# Patient Record
Sex: Female | Born: 1960
Health system: Southern US, Community
[De-identification: ages and names within clinical notes are randomized; demographics above are authoritative.]

## PROBLEM LIST (undated history)

## (undated) DIAGNOSIS — C801 Malignant (primary) neoplasm, unspecified: Secondary | ICD-10-CM

## (undated) DIAGNOSIS — M1990 Unspecified osteoarthritis, unspecified site: Secondary | ICD-10-CM

## (undated) DIAGNOSIS — R5383 Other fatigue: Secondary | ICD-10-CM

## (undated) DIAGNOSIS — R112 Nausea with vomiting, unspecified: Secondary | ICD-10-CM

## (undated) DIAGNOSIS — F419 Anxiety disorder, unspecified: Secondary | ICD-10-CM

## (undated) DIAGNOSIS — K5792 Diverticulitis of intestine, part unspecified, without perforation or abscess without bleeding: Secondary | ICD-10-CM

## (undated) DIAGNOSIS — E079 Disorder of thyroid, unspecified: Secondary | ICD-10-CM

## (undated) DIAGNOSIS — R42 Dizziness and giddiness: Secondary | ICD-10-CM

## (undated) DIAGNOSIS — Z9889 Other specified postprocedural states: Secondary | ICD-10-CM

## (undated) DIAGNOSIS — I499 Cardiac arrhythmia, unspecified: Secondary | ICD-10-CM

## (undated) DIAGNOSIS — E039 Hypothyroidism, unspecified: Secondary | ICD-10-CM

## (undated) DIAGNOSIS — J302 Other seasonal allergic rhinitis: Secondary | ICD-10-CM

## (undated) DIAGNOSIS — K5900 Constipation, unspecified: Secondary | ICD-10-CM

## (undated) DIAGNOSIS — D649 Anemia, unspecified: Secondary | ICD-10-CM

## (undated) HISTORY — DX: Constipation, unspecified: K59.00

## (undated) HISTORY — DX: Unspecified osteoarthritis, unspecified site: M19.90

## (undated) HISTORY — DX: Diverticulitis of intestine, part unspecified, without perforation or abscess without bleeding: K57.92

## (undated) HISTORY — DX: Hypothyroidism, unspecified: E03.9

## (undated) HISTORY — DX: Other seasonal allergic rhinitis: J30.2

## (undated) HISTORY — DX: Anxiety disorder, unspecified: F41.9

## (undated) HISTORY — DX: Disorder of thyroid, unspecified: E07.9

## (undated) HISTORY — DX: Other fatigue: R53.83

---
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
## 2001-02-27 ENCOUNTER — Other Ambulatory Visit: Admission: RE | Admit: 2001-02-27 | Discharge: 2001-02-27 | Payer: Self-pay | Admitting: Obstetrics and Gynecology

## 2002-11-04 ENCOUNTER — Other Ambulatory Visit: Admission: RE | Admit: 2002-11-04 | Discharge: 2002-11-04 | Payer: Self-pay | Admitting: Obstetrics and Gynecology

## 2003-12-01 ENCOUNTER — Other Ambulatory Visit: Admission: RE | Admit: 2003-12-01 | Discharge: 2003-12-01 | Payer: Self-pay | Admitting: Obstetrics and Gynecology

## 2010-10-04 ENCOUNTER — Ambulatory Visit
Admission: RE | Admit: 2010-10-04 | Discharge: 2010-10-04 | Disposition: A | Discharge status: Home / Self Care | Payer: 59 | Source: Ambulatory Visit | Attending: Family Medicine | Admitting: Family Medicine

## 2010-10-04 ENCOUNTER — Other Ambulatory Visit: Payer: Self-pay | Admitting: Family Medicine

## 2010-10-04 DIAGNOSIS — T148XXA Other injury of unspecified body region, initial encounter: Secondary | ICD-10-CM

## 2013-01-09 DIAGNOSIS — I499 Cardiac arrhythmia, unspecified: Secondary | ICD-10-CM

## 2013-01-09 HISTORY — DX: Cardiac arrhythmia, unspecified: I49.9

## 2013-05-12 ENCOUNTER — Encounter: Payer: Self-pay | Admitting: Neurology

## 2013-05-12 ENCOUNTER — Ambulatory Visit (INDEPENDENT_AMBULATORY_CARE_PROVIDER_SITE_OTHER): Payer: 59 | Admitting: Neurology

## 2013-05-12 VITALS — BP 104/60 | HR 72 | Resp 16 | Ht 65.5 in | Wt 140.0 lb

## 2013-05-12 DIAGNOSIS — R42 Dizziness and giddiness: Secondary | ICD-10-CM

## 2013-05-12 DIAGNOSIS — R51 Headache: Secondary | ICD-10-CM

## 2013-05-12 DIAGNOSIS — R519 Headache, unspecified: Secondary | ICD-10-CM | POA: Insufficient documentation

## 2013-05-12 NOTE — Patient Instructions (Signed)
1. Bloodwork for ESR, CRP 2. Refer for vestibular therapy 3. If bloodwork normal, we will discuss symptomatic treatment with daily headache preventative medication

## 2013-05-12 NOTE — Progress Notes (Signed)
NEUROLOGY CONSULTATION NOTE  Brooke Green MRN: [id number] DOB: 1960/07/20  Referring provider: Dr. Carol Ada Primary care provider: Dr. Carol Ada  Reason for consult:  New onset persistent headache, dizziness  Dear Dr Tamala Julian:  Thank you for your kind referral of Brooke Green for consultation of the above symptoms. Although her history is well known to you, please allow me to reiterate it for the purpose of our medical record. The patient was accompanied to the clinic by her husband who also provides collateral information. Records and images were personally reviewed where available.  HISTORY OF PRESENT ILLNESS: This is a very pleasant 53 year old right-handed woman with a history of hypothyroidism and anxiety, in her usual state of health until 03/26/2013 when after running 1/2 mile to the house because the bus came early for her daughter, she recalls feeling out of breath, then turned around and could not walk straight. She kept pulling to the right, she felt sick, and had to sit down with her eyes closed because of violent spinning.  This lasted 30 minutes, she had to lay down flat on the floor of her garage until symptoms improved an hour later.  She continued to feel sick the rest of the day.  She was feeling fine the next day, however 2 days later she started having a throbbing pain on the right temple and around her right eye.  This resolved and was followed by a constant diffuse head pressure for 10 days.  She also had dizziness that weekend, and the week after woke up with a "swishing" inside her head, "like water sloshing back and forth."  She had an MRI brain with and without contrast and MRA head which I personally reviewed, no acute abnormalities seen.  There is an incidental finding of a left frontal extra-axial dural based enhancing 1.4 x 1.2 x 0.8 cm lesion consistent with a small meningioma without significant mass effect on the adjacent brain parenchyma. She was started  on a tapering course of prednisone, which helped significantly with the dizziness.  She continued to have headaches, and when the prednisone taper finished, she started having brief minor dizzy spells lasting 15 seconds.  These can occur when turning or bending down, but have also occurred while just standing cutting peppers at the counter.  She reports symptoms are worse when she wakes up in the morning and gets out of bed, but if she wakes up in the middle of the night, she does not feel unsteady.  The headaches resolved after the second course of Prednisone, however 4 days ago she again had throbbing over the right temporal region with tenderness to palpation, followed by diffuse mild pressure.    She feels that over the last couple of weeks, her stomach has not been the same.  There is no abdominal pain, however she feels that "things are not going down quickly," she feels nauseated with an upset stomach.  This has improved somewhat.  She denies any diplopia, dysarthria, dysphagia, neck/back pain, bladder incontinence.  No recent diet changes, no recent travel, no new medications, no recent head injuries or infection except for a stomach virus last weekend.  PAST MEDICAL HISTORY: Past Medical History  Diagnosis Date  . Hypothyroid   . Anxiety     PAST SURGICAL HISTORY: Past Surgical History  Procedure Laterality Date  . Appendectomy  1967  . Cesarean section  1996    MEDICATIONS: No current outpatient prescriptions on file prior to visit.  No current facility-administered medications on file prior to visit.    ALLERGIES: No Known Allergies  FAMILY HISTORY: Family History  Problem Relation Age of Onset  . Thyroid cancer Mother   . Breast cancer Mother   . Skin cancer Mother   . Melanoma Father   . Atrial fibrillation Mother     SOCIAL HISTORY: History   Social History  . Marital Status: Married    Spouse Name: N/A    Number of Children: N/A  . Years of Education: N/A     Occupational History  . Not on file.   Social History Main Topics  . Smoking status: Never Smoker   . Smokeless tobacco: Not on file  . Alcohol Use: Yes     Comment: rarely  . Drug Use: No  . Sexual Activity: Not on file   Other Topics Concern  . Not on file   Social History Narrative  . No narrative on file    REVIEW OF SYSTEMS: Constitutional: No fevers, chills, or sweats, no generalized fatigue, change in appetite Eyes: No visual changes, double vision, eye pain Ear, nose and throat: No hearing loss, ear pain, nasal congestion, sore throat Cardiovascular: No chest pain, palpitations Respiratory:  No shortness of breath at rest or with exertion, wheezes GastrointestinaI: as above Genitourinary:  No dysuria, urinary retention or frequency Musculoskeletal:  No neck pain, back pain Integumentary: No rash, pruritus, skin lesions Neurological: as above Psychiatric: No depression, insomnia, anxiety Endocrine: No palpitations, fatigue, diaphoresis, mood swings, change in appetite, change in weight, increased thirst Hematologic/Lymphatic:  No anemia, purpura, petechiae. Allergic/Immunologic: no itchy/runny eyes, nasal congestion, recent allergic reactions, rashes  PHYSICAL EXAM: Filed Vitals:   05/12/13 1239  BP: 104/60  Pulse: 72  Resp: 16   General: No acute distress Head:  Normocephalic/atraumatic, tenderness to palpation over the right temporal region and slightly in the right occipital region Neck: supple, no paraspinal tenderness, full range of motion Back: No paraspinal tenderness Heart: regular rate and rhythm Lungs: Clear to auscultation bilaterally. Vascular: No carotid bruits. Skin/Extremities: No rash, no edema Neurological Exam: Mental status: alert and oriented to person, place, and time, no dysarthria or aphasia, Fund of knowledge is appropriate.  Recent and remote memory are intact.  Attention and concentration are normal.    Able to name objects and  repeat phrases. Cranial nerves: CN I: not tested CN II: pupils equal, round and reactive to light, visual fields intact, fundi unremarkable. CN III, IV, VI:  full range of motion, no nystagmus, no ptosis CN V: facial sensation intact CN VII: upper and lower face symmetric CN VIII: hearing intact CN IX, X: gag intact, uvula midline CN XI: sternocleidomastoid and trapezius muscles intact CN XII: tongue midline Bulk & Tone: normal, no fasciculations. Motor: 5/5 throughout with no pronator drift. Sensation: intact to light touch, cold, pin, vibration and joint position sense.  No extinction to double simultaneous stimulation.  Romberg test negative Deep Tendon Reflexes: +2 throughout, no ankle clonus Plantar responses: downgoing bilaterally Cerebellar: no incoordination on finger to nose, heel to shin. No dysdiadochokinesia Gait: narrow-based and steady, able to tandem walk adequately. Tremor: none  IMPRESSION: This is a very pleasant 53 year old right-handed woman with a history of hypothyroidism and anxiety presenting for new onset headaches and dizziness that started after she had to quickly run home on 03/26/2013.  The initial episode of vertigo is suggestive of vestibular dysfunction, the etiology of the headaches is unclear.  Her neurological exam  is non-focal, there is tenderness to palpation over the right temporal region.  ESR and CRP will be ordered to assess for temporal arteritis.  Her MRI/MRA head do not show any acute changes, there is a small left frontal meningioma without mass effect that is incidental.  Follow-up imaging will be ordered in a year.  She will be referred for vestibular therapy.  We discussed that if bloodwork is unremarkable and headaches continue, she may benefit from a daily headache preventative medication such as amitriptyline/nortriptyline.  She will follow-up in 2 months.  Thank you for allowing me to participate in the care of this patient. Please do not  hesitate to call for any questions or concerns.   Ellouise Newer, M.D.

## 2013-05-13 ENCOUNTER — Other Ambulatory Visit: Payer: Self-pay | Admitting: Neurology

## 2013-05-13 DIAGNOSIS — F419 Anxiety disorder, unspecified: Secondary | ICD-10-CM | POA: Insufficient documentation

## 2013-05-13 DIAGNOSIS — E039 Hypothyroidism, unspecified: Secondary | ICD-10-CM | POA: Insufficient documentation

## 2013-05-13 LAB — SEDIMENTATION RATE: Sed Rate: 4 mm/hr (ref 0–22)

## 2013-05-14 LAB — C-REACTIVE PROTEIN: CRP: <0.5 mg/dL (ref <0.60)

## 2013-05-14 LAB — RPR

## 2013-06-04 ENCOUNTER — Ambulatory Visit: Payer: 59 | Attending: Neurology | Admitting: Rehabilitative and Restorative Service Providers"

## 2013-06-04 DIAGNOSIS — IMO0001 Reserved for inherently not codable concepts without codable children: Secondary | ICD-10-CM | POA: Diagnosis not present

## 2013-06-04 DIAGNOSIS — R42 Dizziness and giddiness: Secondary | ICD-10-CM | POA: Diagnosis not present

## 2013-06-11 ENCOUNTER — Ambulatory Visit: Payer: 59 | Attending: Neurology | Admitting: Rehabilitative and Restorative Service Providers"

## 2013-06-11 DIAGNOSIS — R42 Dizziness and giddiness: Secondary | ICD-10-CM | POA: Insufficient documentation

## 2013-06-11 DIAGNOSIS — IMO0001 Reserved for inherently not codable concepts without codable children: Secondary | ICD-10-CM | POA: Diagnosis present

## 2013-06-18 ENCOUNTER — Ambulatory Visit: Payer: 59 | Admitting: Rehabilitative and Restorative Service Providers"

## 2013-06-18 DIAGNOSIS — IMO0001 Reserved for inherently not codable concepts without codable children: Secondary | ICD-10-CM | POA: Diagnosis not present

## 2013-07-02 ENCOUNTER — Encounter: Payer: 59 | Admitting: Rehabilitative and Restorative Service Providers"

## 2013-07-15 ENCOUNTER — Ambulatory Visit (INDEPENDENT_AMBULATORY_CARE_PROVIDER_SITE_OTHER): Payer: 59 | Admitting: Neurology

## 2013-07-15 ENCOUNTER — Encounter: Payer: Self-pay | Admitting: *Deleted

## 2013-07-15 ENCOUNTER — Encounter: Payer: Self-pay | Admitting: Neurology

## 2013-07-15 VITALS — BP 120/72 | HR 72 | Resp 16 | Ht 66.0 in | Wt 142.0 lb

## 2013-07-15 DIAGNOSIS — D320 Benign neoplasm of cerebral meninges: Secondary | ICD-10-CM

## 2013-07-15 DIAGNOSIS — R42 Dizziness and giddiness: Secondary | ICD-10-CM

## 2013-07-15 DIAGNOSIS — D329 Benign neoplasm of meninges, unspecified: Secondary | ICD-10-CM

## 2013-07-15 DIAGNOSIS — R51 Headache: Secondary | ICD-10-CM

## 2013-07-15 NOTE — Progress Notes (Signed)
NEUROLOGY FOLLOW UP OFFICE NOTE  Brooke Green [id number]  HISTORY OF PRESENT ILLNESS: I had the pleasure of seeing Brooke Green in follow-up in the neurology clinic on 07/15/2013.  The patient was last seen 2 months ago for dizziness and head pressure.  MRI brain and MRA head unremarkable except for incidental finding of small left frontal meningioma without mass effect.  Her neurological exam was normal. ESR and CRP normal.  She was referred for vestibular therapy, and reports that symptoms settled down after therapy for 3 weeks, however symptoms "returned with avengeance," with dizziness now occurring daily. Bending over would cause brief dizziness, however other times the dizziness would get worse that she would have nausea and feel sick to her stomach, with associated pressure sensation inside her head. She reports the pressure is not a headache, it feels like a movement inside her head. She feels fuzzy with difficulty concentrating, no associated tinnitus, photo/phonophobia, focal numbness/tingling/weakness.  She spoke to her friend with a diagnosis of Postural Orthostatic Tachycardia Syndrome (POTS) and started reading about it, feeling that it described all her symptoms.  She checked her heart rate with positional changes, and noted that HR increased by 35 bpm standing up.  She increased her salt intake the past 2 weeks, and feels that her symptoms are now mild with this change. She is scheduled to see cardiologist Dr. Caryl Comes for evaluation of POTS.  HPI:  This is a very pleasant Brooke Green with a history of hypothyroidism and anxiety, in her usual state of health until 03/26/2013 when after running 1/2 mile to the house because the bus came early for her daughter, she recalls feeling out of breath, then turned around and could not walk straight. She kept pulling to the right, she felt sick, and had to sit down with her eyes closed because of violent spinning. This lasted 30 minutes, she had  to lay down flat on the floor of her garage until symptoms improved an hour later. She continued to feel sick the rest of the day. She was feeling fine the next day, however 2 days later she started having a throbbing pain on the right temple and around her right eye. This resolved and was followed by a constant diffuse head pressure for 10 days. She also had dizziness that weekend, and the week after woke up with a "swishing" inside her head, "like water sloshing back and forth." She had an MRI brain with and without contrast and MRA head which I personally reviewed, no acute abnormalities seen. There is an incidental finding of a left frontal extra-axial dural based enhancing 1.4 x 1.2 x 0.8 cm lesion consistent with a small meningioma without significant mass effect on the adjacent brain parenchyma. She was started on a tapering course of prednisone, which helped significantly with the dizziness. She continued to have headaches, and when the prednisone taper finished, she started having brief minor dizzy spells lasting 15 seconds. These can occur when turning or bending down, but have also occurred while just standing cutting peppers at the counter. She reports symptoms are worse when she wakes up in the morning and gets out of bed, but if she wakes up in the middle of the night, she does not feel unsteady. The headaches resolved after the second course of Prednisone, however 4 days ago she again had throbbing over the right temporal region with tenderness to palpation, followed by diffuse mild pressure.   She feels that over the last  couple of weeks, her stomach has not been the same. There is no abdominal pain, however she feels that "things are not going down quickly," she feels nauseated with an upset stomach. This has improved somewhat.   PAST MEDICAL HISTORY: Past Medical History  Diagnosis Date  . Hypothyroid   . Anxiety     MEDICATIONS: Current Outpatient Prescriptions on File Prior to Visit    Medication Sig Dispense Refill  . levothyroxine (SYNTHROID, LEVOTHROID) 50 MCG tablet Take 50 mcg by mouth daily before breakfast.      . sertraline (ZOLOFT) 25 MG tablet Take 25 mg by mouth daily.       No current facility-administered medications on file prior to visit.    ALLERGIES: No Known Allergies  FAMILY HISTORY: Family History  Problem Relation Age of Onset  . Thyroid cancer Mother   . Breast cancer Mother   . Skin cancer Mother   . Melanoma Father   . Atrial fibrillation Mother     SOCIAL HISTORY: History   Social History  . Marital Status: Married    Spouse Name: N/A    Number of Children: N/A  . Years of Education: N/A   Occupational History  . Not on file.   Social History Main Topics  . Smoking status: Never Smoker   . Smokeless tobacco: Not on file  . Alcohol Use: Yes     Comment: rarely  . Drug Use: No  . Sexual Activity: Not on file   Other Topics Concern  . Not on file   Social History Narrative  . No narrative on file    REVIEW OF SYSTEMS: Constitutional: No fevers, chills, or sweats, no generalized fatigue, change in appetite Eyes: No visual changes, double vision, eye pain Ear, nose and throat: No hearing loss, ear pain, nasal congestion, sore throat Cardiovascular: No chest pain, palpitations Respiratory:  No shortness of breath at rest or with exertion, wheezes GastrointestinaI: + nausea, no vomiting, diarrhea, abdominal pain, fecal incontinence Genitourinary:  No dysuria, urinary retention or frequency Musculoskeletal:  No neck pain, back pain Integumentary: No rash, pruritus, skin lesions Neurological: as above Psychiatric: No depression, insomnia, + anxiety Endocrine: No palpitations, fatigue, diaphoresis, mood swings, change in appetite, change in weight, increased thirst Hematologic/Lymphatic:  No anemia, purpura, petechiae. Allergic/Immunologic: no itchy/runny eyes, nasal congestion, recent allergic reactions,  rashes  PHYSICAL EXAM: Filed Vitals:   07/15/13 1302  BP: 120/72  Pulse: 72  Resp: 16   General: No acute distress Head:  Normocephalic/atraumatic Neck: supple, no paraspinal tenderness, full range of motion Heart:  Regular rate and rhythm Lungs:  Clear to auscultation bilaterally Back: No paraspinal tenderness Skin/Extremities: No rash, no edema Neurological Exam: alert and oriented to person, place, and time. No aphasia or dysarthria. Fund of knowledge is appropriate.  Recent and remote memory are intact.  Attention and concentration are normal.    Able to name objects and repeat phrases. Cranial nerves: Pupils equal, round, reactive to light.  Fundoscopic exam unremarkable, no papilledema. Extraocular movements intact with no nystagmus. Visual fields full. Facial sensation intact. No facial asymmetry. Tongue, uvula, palate midline.  Motor: Bulk and tone normal, muscle strength 5/5 throughout with no pronator drift.  Sensation to light touch, temperature and vibration intact.  No extinction to double simultaneous stimulation.  Deep tendon reflexes 2+ throughout, toes downgoing.  Finger to nose testing intact.  Gait narrow-based and steady, able to tandem walk adequately.  Romberg negative.  IMPRESSION: This is a very pleasant  Brooke year old right-handed Green with a history of hypothyroidism and anxiety who presented with new onset dizziness and head pressure. She reports that this is not a headache, but instead a pressure with movement sensation in her head.  Her neurological exam is normal, brain imaging unremarkable except for incidental finding of small left frontal meningioma that will be followed annually for now.  She feels that her symptoms are consistent with POTS and will be seeing cardiologist Dr. Caryl Comes for this.  She will follow-up on an annual basis to follow-up on meningioma, and knows to call our office for any problems.    Thank you for allowing me to participate in her care.   Please do not hesitate to call for any questions or concerns.  The duration of this appointment visit was 15 minutes of face-to-face time with the patient.  Greater than 50% of this time was spent in counseling, explanation of diagnosis, planning of further management, and coordination of care.   Ellouise Newer, M.D.   CC: Dr. Tamala Julian

## 2013-07-15 NOTE — Addendum Note (Signed)
Addended by: Thurmon Fair on: 07/15/2013 04:51 PM   Modules accepted: Orders

## 2013-07-15 NOTE — Patient Instructions (Addendum)
1. Follow-up with Dr. Caryl Comes as scheduled 2. Follow-up annually for MRI brain for meningioma 3. Call our office for any problems

## 2013-07-16 ENCOUNTER — Ambulatory Visit: Payer: 59 | Admitting: Rehabilitative and Restorative Service Providers"

## 2013-07-16 ENCOUNTER — Encounter: Payer: Self-pay | Admitting: Family Medicine

## 2013-07-24 ENCOUNTER — Encounter: Payer: Self-pay | Admitting: *Deleted

## 2013-07-30 ENCOUNTER — Institutional Professional Consult (permissible substitution): Payer: 59 | Admitting: Internal Medicine

## 2013-08-08 ENCOUNTER — Ambulatory Visit (INDEPENDENT_AMBULATORY_CARE_PROVIDER_SITE_OTHER): Payer: 59 | Admitting: Internal Medicine

## 2013-08-08 ENCOUNTER — Encounter: Payer: Self-pay | Admitting: Internal Medicine

## 2013-08-08 VITALS — BP 124/82 | HR 78 | Ht 66.0 in | Wt 144.0 lb

## 2013-08-08 DIAGNOSIS — R42 Dizziness and giddiness: Secondary | ICD-10-CM

## 2013-08-08 NOTE — Patient Instructions (Signed)
Your physician recommends that you continue on your current medications as directed. Please refer to the Current Medication list given to you today.  Your physician has recommended that you have a tilt table test. This test is sometimes used to help determine the cause of fainting spells. You lie on a table that moves from a lying down to an upright position. The change in position can bring on loss of consciousness. The doctor monitors your symptoms, heart rate, EKG, and blood pressure throughout the test. The doctor also may give you a medicine and then monitor your response to the medicine. This is done in the hospital and usually takes half of a day to complete the procedure. Please see the instruction sheet given to you today for more information.  The following are available dates:  8/19, 8/21, 8/24, 8/31 -- Sherri, RN  will contact you next week to schedule this.   Tilt Table Test A tilt table test is a test to evaluate the cause of unexplained fainting (syncope) or dizziness. You will be safely secured to a table that moves you from a lying position to an upright position. During the test, your heart rate, heart rhythm, and blood pressure will be monitored. Your caregiver may order a tilt table test for the following reasons:  Recurrent, unexplained loss of consciousness.  Recurrent near loss of consciousness.  Recurrent, unexplained dizziness.  Recurrent falls, especially in elderly people.  Sudden drop in blood pressure when standing up (orthostatic hypotension). Causes of unexplained fainting or near fainting can involve an imbalance between the nervous and vascular systems. Fainting can occur from a drop in blood pressure or from a drop in heart rate that causes a drop in blood pressure.  RISKS AND COMPLICATIONS During the test, you may:  Feel lightheaded or dizzy.  Faint.  Feel nauseous or vomit.  Have blurry vision.  Feel cold or clammy. BEFORE THE PROCEDURE  Do not eat  before the test. Your caregiver will tell you how many hours before the test you need to stop eating.  Ask your caregiver about changing or stopping your regular medicines. This is especially important if you are taking diabetes or blood pressure medicine.  Make sure you let your primary caregiver know that you are having a tilt table test. PROCEDURE  An intravenous line (IV) will be inserted into a vein in your hand or arm.  Your heart rate, heart rhythm, blood pressure, and oxygen saturation will be continuously monitored throughout the test.  You will lie down on a table. The table will have a footboard and safety straps to keep you in place.  While lying flat, the neck arteries (carotid arteries) on either side of your neck will be checked. This involves your caregiver listening with a stethoscope to the carotid arteries to check for abnormal sounds (bruits). Your caregiver will then press on the neck arteries one at a time for several seconds. These steps will be repeated when you are in an upright position.  After you have been lying down for a period of time, you will be placed in an upright position. If you have any feeling of dizziness or feel like you are going to faint, let your caregiver know right away.  If you have dizziness, a drop in blood pressure, or a drop in heart rate, the table will be immediately placed back in a flat or head down position. If your heart rate or blood pressure does not return to normal after being  placed in a flat position, medicine can be given to help with symptoms of low blood pressure or heart rate.  If you do not have symptoms when placed in an upright position, medicine may be given to provoke symptoms of dizziness while you are in an upright position for the test. Medicine may be given under the tongue or in the IV. AFTER THE PROCEDURE  When your vital signs are stable and you do not have symptoms of dizziness or feel like you may faint, you may be  allowed to go home.  Arrange for someone to drive you home.  Ask when your test results will be ready. Make sure you get your test results. Document Released: 12/08/2003 Document Revised: 06/27/2011 Document Reviewed: 03/30/2011 [facility] Patient Information 2015 [place], [place]. This information is not intended to replace advice given to you by your health care provider. Make sure you discuss any questions you have with your health care provider.

## 2013-08-08 NOTE — Progress Notes (Signed)
Primary Care Physician: Reginia Naas, MD Referring Physician: Same    Brooke Green is a 53 y.o. female with a h/o  thyroid disease, anxiety here today to be evaluated for POTS. Her symptoms started in March when she had to run to her daughter's school bus. She returned to the house feeling off balance, nauseated, with a sensation of the room spinning. Her symptoms abated after 1-1/2 hours. The next day she felt fine. After that she developed a headache with dizziness and felt like water was moving around in her head. She presented to her PCP who ordered  an MRA which was unremarkable except for incidental findings of a small left frontal meningioma without mass effect. She was treated with prednisone x2 which  helped improve her symptoms, but they returned after taper was completed She was evaluated by neurology due to persistent symptoms. Her symptoms did not seem to be consistent with a neurologic etiology and she was referred to ENT for evaluation of benign positional vertigo. She did undergo  epley maneuvers with somewhat improvement of symptoms.  However,symptoms have persisted.  She has 2 different sets of symptoms. One is  brief episodes of dizziness lasting less than 5-10 seconds. She is able to carry out her daily activities without any significant limitations.  Her other set of symptoms are more life style limiting and include prolonged spells of dizziness associated with pressure headache and nausea. Prolonged spells usually will last for a whole day and will start after she assumes an upright position in the a.m. Will improve assuming a lying position.  She has a good friend who has the diagnosis of POTS and she encouraged her to read up on the condition. Patient noticed when she increases her salt intake her symptoms are less. Heat related activities such a showering and exercising do not seem to exacerbate symptoms. Orthostatic vital signs, done in the office today do not show any  significant orthostasis and do not support the diagnosis of POTS.  Today, she denies symptoms of palpitations, chest pain, shortness of breath, orthopnea, PND, lower extremity edema. Today, she is having one of her prolonged spells of dizziness headache and nausea.  Past Medical History  Diagnosis Date  . Hypothyroid   . Anxiety   . Nephrolithiasis   . Constipation   . Diverticulitis   . Seasonal allergies   . Constipation   . Fatigue   . Arthritis   . Thyroid disease    Past Surgical History  Procedure Laterality Date  . Appendectomy  1967  . Cesarean section  1996    Current Outpatient Prescriptions  Medication Sig Dispense Refill  . doxylamine, Sleep, (SLEEP AID) 25 MG tablet Take 25 mg by mouth at bedtime as needed.      Marland Kitchen levothyroxine (SYNTHROID, LEVOTHROID) 50 MCG tablet Take 50 mcg by mouth daily before breakfast.      . sertraline (ZOLOFT) 25 MG tablet Take 25 mg by mouth daily.       No current facility-administered medications for this visit.    No Known Allergies  History   Social History  . Marital Status: Married    Spouse Name: N/A    Number of Children: N/A  . Years of Education: N/A   Occupational History  . Not on file.   Social History Main Topics  . Smoking status: Never Smoker   . Smokeless tobacco: Not on file  . Alcohol Use: Yes     Comment: rarely  . Drug  Use: No  . Sexual Activity: Not on file   Other Topics Concern  . Not on file   Social History Narrative  . No narrative on file    Family History  Problem Relation Age of Onset  . Thyroid cancer Mother   . Breast cancer Mother   . Skin cancer Mother   . Atrial fibrillation Mother   . Hypertension Mother   . Melanoma Father     ROS- All systems are reviewed and negative except as per the HPI above  Physical Exam: Filed Vitals:   08/08/13 1043  BP: 124/82  Pulse: 78  Height: 5\' 6"  (1.676 m)  Weight: 65.318 kg (144 lb)    GEN- The patient is well appearing, alert  and oriented x 3 today.   Head- normocephalic, atraumatic Eyes-  Sclera clear, conjunctiva pink Ears- hearing intact Oropharynx- clear Neck- supple, no JVP Lymph- no cervical lymphadenopathy Lungs- Clear to ausculation bilaterally, normal work of breathing Heart- Regular rate and rhythm, no murmurs, rubs or gallops, PMI not laterally displaced GI- soft, NT, ND, + BS Extremities- no clubbing, cyanosis, or edema MS- no significant deformity or atrophy Skin- no rash or lesion Psych- euthymic mood, full affect Neuro- strength and sensation are intact  EKG- reveals normal sinus rhythm at 78 beats per minute normal EKG with a QTC of 490 ms.  Assessment and Plan:  1. Symptoms, questionable etiology.  Does not sound typical for  POTS, but  may represent  an atypical POTS.  To be scheduled for a tilt table  at [facility] for further evaluation.

## 2013-08-08 NOTE — Progress Notes (Signed)
Lightheadedness  Vertigo  The patient has an unusual symptom complex with which I am unfamiliar. They started abruptly with what sounds like acute vertigo. Since then Brooke Green has had problems with recurring headaches that are associated with dizziness which appears to be orthostatic. Brooke Green has reported changes in her heart rate at home in the 30 beat per minute range upon standing. These are not reproducing her. Brooke Green has tried, in the interval, to increase her salt and water intake and thought that her symptoms were improving on this; recently they have worsened again.  The question for my perspective is does this represent an atypical this autonomic syndrome as suggested by the relief of lightheadedness with sitting or at altogether different type of syndrome as suggested by the associated headaches in the initiating of this event without acute vertigo.  Brooke Green has appropriate autonomic innervation of her heart the respiratory ratios. There is no other manifestations of systemic autonomic insufficiency.  I suggested we undertake tilt table testing to see whether we can objectively clarify the presence of an autonomic disorder Brooke Green is agreeable

## 2013-08-14 ENCOUNTER — Telehealth: Payer: Self-pay | Admitting: *Deleted

## 2013-08-14 NOTE — Telephone Encounter (Signed)
Left message to call back. Need to arrange tilt table testing. Left message that I would be out of the office until 8/18.

## 2013-08-25 ENCOUNTER — Telehealth: Payer: Self-pay | Admitting: Internal Medicine

## 2013-08-25 NOTE — Telephone Encounter (Signed)
See phone note 08/14/13, will forward to [facility]. LM for pt to expect call 08/26/13.

## 2013-08-25 NOTE — Telephone Encounter (Signed)
Follow Up  Pt returned call to schedule tilt table test// SR

## 2013-08-28 ENCOUNTER — Institutional Professional Consult (permissible substitution): Payer: 59 | Admitting: Internal Medicine

## 2013-09-12 NOTE — Telephone Encounter (Signed)
Patient calls back telling me how much better dizziness is (she states best it has been since March). She states that it is in "remission" for now. She would like to try and cut back on her salt intake and see what happens - see if symptoms return.  She would like to hold off on tilt table testing for now and will come in for OV to discuss next step in plan of care. She will call back if symptoms return between now and then. She is aware scheduler will call her next week to arrange follow up with Dr. Caryl Comes in October.

## 2013-09-25 ENCOUNTER — Encounter: Payer: Self-pay | Admitting: Internal Medicine

## 2013-11-07 ENCOUNTER — Ambulatory Visit (INDEPENDENT_AMBULATORY_CARE_PROVIDER_SITE_OTHER): Payer: 59 | Admitting: Internal Medicine

## 2013-11-07 ENCOUNTER — Encounter: Payer: Self-pay | Admitting: Internal Medicine

## 2013-11-07 VITALS — BP 137/73 | HR 88 | Ht 66.0 in | Wt 148.0 lb

## 2013-11-07 DIAGNOSIS — R42 Dizziness and giddiness: Secondary | ICD-10-CM

## 2013-11-07 DIAGNOSIS — R000 Tachycardia, unspecified: Secondary | ICD-10-CM

## 2013-11-07 NOTE — Progress Notes (Signed)
      Patient Care Team: Candace Wyline Copas, MD as PCP - General (Family Medicine)   HPI  Brooke Green is a 53 y.o. female Seen in [place] for an unusual symptom complex characterized byvertigo nausea and orthostatic stress,.  She has spells of dizziness which can be brief or assoc with headache and last all day ong  These have begun abruptly in march when we saw her in July recommended tilt which was deferred as she became much less symptomatic  Overall she has been better. She does have periodic episodes however a feeling anxious associated with her heart racing. Her blood work is drawn routinely by Dr. Matthew Saras and was last done in May at which point we presume it was normal although these data are not available.    Past Medical History  Diagnosis Date  . Hypothyroid   . Anxiety   . Nephrolithiasis   . Constipation   . Diverticulitis   . Seasonal allergies   . Constipation   . Fatigue   . Arthritis   . Thyroid disease     Past Surgical History  Procedure Laterality Date  . Appendectomy  1967  . Cesarean section  1996    Current Outpatient Prescriptions  Medication Sig Dispense Refill  . DENTA 5000 PLUS 1.1 % CREA dental cream Take 1 application by mouth daily.      Marland Kitchen doxylamine, Sleep, (SLEEP AID) 25 MG tablet Take 25 mg by mouth at bedtime as needed.      Marland Kitchen levothyroxine (SYNTHROID, LEVOTHROID) 50 MCG tablet Take 50 mcg by mouth daily before breakfast.       No current facility-administered medications for this visit.    No Known Allergies  Review of Systems negative except from HPI and PMH  Physical Exam BP 137/73  Pulse 88  Ht 5\' 6"  (1.676 m)  Wt 148 lb (67.132 kg)  BMI 23.90 kg/m2 Well developed and nourished in no acute distress HENT normal Neck supple with JVP-flat Clear Regular rate and rhythm, no murmurs or gallops Abd-soft with active BS No Clubbing cyanosis edema Skin-warm and dry A & Oriented  Grossly normal sensory and motor  function]  ECG demonstrates sinus rhythm at 74 Interval 11/16/34   Assessment and  Plan  Tachycardia  Lightheadedness  Treated hypothyroidism  Her tachycardia seems to have abated before we got a ECG; I wonder whether she doesn't have an atrial tachycardia. We will use [employer] monitor to try to elucidate.  Otherwise she will continue on salt and water repletion. We'll see her in 6 months

## 2013-11-07 NOTE — Patient Instructions (Signed)
Your physician recommends that you continue on your current medications as directed. Please refer to the Current Medication list given to you today.  Your physician wants you to follow-up in: 1 year with Dr. Klein.  You will receive a reminder letter in the mail two months in advance. If you don't receive a letter, please call our office to schedule the follow-up appointment.  

## 2014-05-25 ENCOUNTER — Encounter: Payer: Self-pay | Admitting: Neurology

## 2014-05-25 ENCOUNTER — Ambulatory Visit (INDEPENDENT_AMBULATORY_CARE_PROVIDER_SITE_OTHER): Payer: 59 | Admitting: Neurology

## 2014-05-25 VITALS — BP 100/70 | HR 90 | Resp 16 | Ht 65.5 in | Wt 147.0 lb

## 2014-05-25 DIAGNOSIS — D329 Benign neoplasm of meninges, unspecified: Secondary | ICD-10-CM

## 2014-05-25 NOTE — Progress Notes (Signed)
NEUROLOGY FOLLOW UP OFFICE NOTE  Brooke Green [id number]  HISTORY OF PRESENT ILLNESS: I had the pleasure of seeing Brooke Green in follow-up in the neurology clinic on 05/25/2014.  The patient was last seen 10 months ago for dizziness and head pressure. She had also been concerned about episodes of tachycardia and has seen cardiologist Dr. Caryl Comes. A tilt table test was recommended, however she cancelled this due to feeling much better. She denies any further dizziness or headaches since September 2015. She had 2 weeks in February where she noticed her heart rate was elevated (between 95 to 110 bpm) even when sitting down, with associated fatigue. This has not recurred. She denies any focal numbness/tingling/weakness, vision changes, dysarthria/dysphagia, neck/back pain. Her MRI brain had shown an incidental finding of a small left frontal meningioma without mass effect.  HPI: This is a very pleasant 54 yo RH woman with a history of hypothyroidism and anxiety, in her usual state of health until 03/26/2013 when after running 1/2 mile to the house because the bus came early for her daughter, she recalls feeling out of breath, then turned around and could not walk straight. She kept pulling to the right, she felt sick, and had to sit down with her eyes closed because of violent spinning. This lasted 30 minutes, she had to lay down flat on the floor of her garage until symptoms improved an hour later. She continued to feel sick the rest of the day. She was feeling fine the next day, however 2 days later she started having a throbbing pain on the right temple and around her right eye. This resolved and was followed by a constant diffuse head pressure for 10 days. She also had dizziness that weekend, and the week after woke up with a "swishing" inside her head, "like water sloshing back and forth." She had an MRI brain with and without contrast and MRA head which I personally reviewed, no acute abnormalities  seen. There is an incidental finding of a left frontal extra-axial dural based enhancing 1.4 x 1.2 x 0.8 cm lesion consistent with a small meningioma without significant mass effect on the adjacent brain parenchyma. She was started on a tapering course of prednisone, which helped significantly with the dizziness. She continued to have headaches, and when the prednisone taper finished, she started having brief minor dizzy spells lasting 15 seconds. These can occur when turning or bending down, but have also occurred while just standing cutting peppers at the counter. She reports symptoms are worse when she wakes up in the morning and gets out of bed, but if she wakes up in the middle of the night, she does not feel unsteady. The headaches resolved after the second course of Prednisone, however 4 days ago she again had throbbing over the right temporal region with tenderness to palpation, followed by diffuse mild pressure.   Diagnostic Data: MRI brain and MRA head unremarkable except for incidental finding of small left frontal meningioma without mass effect. Her neurological exam was normal. ESR and CRP normal.   PAST MEDICAL HISTORY: Past Medical History  Diagnosis Date  . Hypothyroid   . Anxiety   . Nephrolithiasis   . Constipation   . Diverticulitis   . Seasonal allergies   . Constipation   . Fatigue   . Arthritis   . Thyroid disease     MEDICATIONS: Current Outpatient Prescriptions on File Prior to Visit  Medication Sig Dispense Refill  . DENTA 5000 PLUS 1.1 %  CREA dental cream Take 1 application by mouth daily.    Marland Kitchen doxylamine, Sleep, (SLEEP AID) 25 MG tablet Take 25 mg by mouth at bedtime as needed.    Marland Kitchen levothyroxine (SYNTHROID, LEVOTHROID) 50 MCG tablet Take 50 mcg by mouth daily before breakfast.     No current facility-administered medications on file prior to visit.    ALLERGIES: No Known Allergies  FAMILY HISTORY: Family History  Problem Relation Age of Onset  .  Thyroid cancer Mother   . Breast cancer Mother   . Skin cancer Mother   . Atrial fibrillation Mother   . Hypertension Mother   . Melanoma Father     SOCIAL HISTORY: History   Social History  . Marital Status: Married    Spouse Name: N/A  . Number of Children: N/A  . Years of Education: N/A   Occupational History  . Not on file.   Social History Main Topics  . Smoking status: Never Smoker   . Smokeless tobacco: Not on file  . Alcohol Use: Yes     Comment: rarely  . Drug Use: No  . Sexual Activity: Not on file   Other Topics Concern  . Not on file   Social History Narrative    REVIEW OF SYSTEMS: Constitutional: No fevers, chills, or sweats, no generalized fatigue, change in appetite Eyes: No visual changes, double vision, eye pain Ear, nose and throat: No hearing loss, ear pain, nasal congestion, sore throat Cardiovascular: No chest pain, palpitations Respiratory:  No shortness of breath at rest or with exertion, wheezes GastrointestinaI: No nausea, vomiting, diarrhea, abdominal pain, fecal incontinence Genitourinary:  No dysuria, urinary retention or frequency Musculoskeletal:  No neck pain, back pain Integumentary: No rash, pruritus, skin lesions Neurological: as above Psychiatric: No depression, insomnia, anxiety Endocrine: No palpitations, fatigue, diaphoresis, mood swings, change in appetite, change in weight, increased thirst Hematologic/Lymphatic:  No anemia, purpura, petechiae. Allergic/Immunologic: no itchy/runny eyes, nasal congestion, recent allergic reactions, rashes  PHYSICAL EXAM: Filed Vitals:   05/25/14 1058  BP: 100/70  Pulse: 90  Resp: 16   General: No acute distress Head:  Normocephalic/atraumatic Neck: supple, no paraspinal tenderness, full range of motion Heart:  Regular rate and rhythm Lungs:  Clear to auscultation bilaterally Back: No paraspinal tenderness Skin/Extremities: No rash, no edema Neurological Exam: alert and oriented to  person, place, and time. No aphasia or dysarthria. Fund of knowledge is appropriate.  Recent and remote memory are intact. 3/3 delayed recall.  Attention and concentration are normal.    Able to name objects and repeat phrases. Cranial nerves: Pupils equal, round, reactive to light.  Fundoscopic exam unremarkable, no papilledema. Extraocular movements intact with no nystagmus. Visual fields full. Facial sensation intact. No facial asymmetry. Tongue, uvula, palate midline.  Motor: Bulk and tone normal, muscle strength 5/5 throughout with no pronator drift.  Sensation to light touch intact.  No extinction to double simultaneous stimulation.  Deep tendon reflexes 2+ throughout, toes downgoing.  Finger to nose testing intact.  Gait narrow-based and steady, able to tandem walk adequately.  Romberg negative.  IMPRESSION: This is a very pleasant 53 yo RH woman with a history of hypothyroidism and anxiety who presented with new onset dizziness and head pressure. This has resolved without recurrence in September 2015. She had episodes of tachycardia, last episode was in February. She has not been able to use the monitor Dr. Caryl Comes recommended. Her neurological exam is normal, brain imaging unremarkable except for incidental finding of small left  frontal meningioma that will be followed annually for now. She will follow-up on an annual basis to follow-up on meningioma, and knows to call our office for any problems.   Thank you for allowing me to participate in her care.  Please do not hesitate to call for any questions or concerns.  The duration of this appointment visit was 15 minutes of face-to-face time with the patient.  Greater than 50% of this time was spent in counseling, explanation of diagnosis, planning of further management, and coordination of care.   Ellouise Newer, M.D.   CC: Dr. Tamala Julian, Dr. Caryl Comes

## 2014-05-25 NOTE — Patient Instructions (Addendum)
1. Schedule MRI brain with and without contrast for meningioma follow-up  2. Follow-up in 1 year, call our office for any changes 3. YOU HAVE BEN SCHEDULED AT [facility] FOR A BRAIN MRI ON 05/29/14. PLEASE ARRIVE @ 10:00 AM.   [address] [place], [place] [address]  [phone]

## 2014-06-11 ENCOUNTER — Telehealth: Payer: Self-pay | Admitting: Family Medicine

## 2014-06-11 NOTE — Telephone Encounter (Signed)
Patient was notified of results.  

## 2014-06-11 NOTE — Telephone Encounter (Signed)
-----   Message from Cameron Sprang, MD sent at 06/11/2014  8:27 AM EDT ----- Regarding: results Pls let her know I reviewed follow-up brain MRI, meningioma size is unchanged. Follow-up in 1 year, call for any changes. Thanks

## 2015-02-24 ENCOUNTER — Encounter: Payer: Self-pay | Admitting: Neurology

## 2015-05-25 ENCOUNTER — Ambulatory Visit: Payer: 59 | Admitting: Neurology

## 2015-06-10 ENCOUNTER — Encounter: Payer: Self-pay | Admitting: Family Medicine

## 2015-08-30 ENCOUNTER — Telehealth: Payer: Self-pay | Admitting: Neurology

## 2015-08-30 ENCOUNTER — Other Ambulatory Visit: Payer: Self-pay | Admitting: *Deleted

## 2015-08-30 DIAGNOSIS — D329 Benign neoplasm of meninges, unspecified: Secondary | ICD-10-CM

## 2015-08-30 NOTE — Telephone Encounter (Signed)
MRI has been ordered for [name] (same location as she went to for last MRI). Per Dr Delice Lesch, pt still needs to be seen for follow up visit even if MRI is not completed prior to the OV appt.  Labs have been ordered too, these labs will need to be drawn prior to MRI.

## 2015-08-30 NOTE — Telephone Encounter (Signed)
Pls order MRI brain with and without contrast for Dx: meningioma. Ok to keep f/u even if MRI not done. Thanks

## 2015-08-30 NOTE — Telephone Encounter (Signed)
Brooke Green  2060/12/16. Her # is [id number]. She is coming up on her 1 year appointment follow up with Dr. Delice Lesch. The patient thought she was to have an MRI before this appointment. She is unsure. She will see Dr. Delice Lesch on Wednesday 09/08/15. Thank you

## 2015-08-30 NOTE — Telephone Encounter (Signed)
I do not see any documentation in last OV stating this.  Please advise

## 2015-08-31 NOTE — Telephone Encounter (Signed)
Notified pt of MRI ordered and Labs that need to be drawn.

## 2015-09-07 ENCOUNTER — Other Ambulatory Visit (INDEPENDENT_AMBULATORY_CARE_PROVIDER_SITE_OTHER): Payer: 59

## 2015-09-07 DIAGNOSIS — D329 Benign neoplasm of meninges, unspecified: Secondary | ICD-10-CM

## 2015-09-07 LAB — CREATININE, SERUM: Creatinine, Ser: 0.8 mg/dL (ref 0.40–1.20)

## 2015-09-07 LAB — BUN: BUN: 11 mg/dL (ref 6–23)

## 2015-09-08 ENCOUNTER — Encounter: Payer: Self-pay | Admitting: Neurology

## 2015-09-08 ENCOUNTER — Ambulatory Visit (INDEPENDENT_AMBULATORY_CARE_PROVIDER_SITE_OTHER): Payer: 59 | Admitting: Neurology

## 2015-09-08 ENCOUNTER — Ambulatory Visit: Payer: 59 | Admitting: Neurology

## 2015-09-08 VITALS — BP 106/74 | HR 77 | Temp 98.3°F | Ht 65.5 in | Wt 143.5 lb

## 2015-09-08 DIAGNOSIS — R42 Dizziness and giddiness: Secondary | ICD-10-CM | POA: Diagnosis not present

## 2015-09-08 DIAGNOSIS — D329 Benign neoplasm of meninges, unspecified: Secondary | ICD-10-CM | POA: Diagnosis not present

## 2015-09-08 MED ORDER — MECLIZINE HCL 12.5 MG PO TABS: 12.5000 mg | ORAL_TABLET | Freq: Three times a day (TID) | ORAL | 5 refills | Status: DC | PRN

## 2015-09-08 NOTE — Progress Notes (Addendum)
NEUROLOGY FOLLOW UP OFFICE NOTE  TRECIA MARING [id number]  HISTORY OF PRESENT ILLNESS: I had the pleasure of seeing Brooke Green in follow-up in the neurology clinic on 09/08/2015.  The patient was last seen a year ago for dizziness and head pressure. As part of her workup, an incidental finding of a small left frontal meningioma without mass effect was seen. Interval annual scans have been stable. She had an MRI brain with and without contrast done today, meningioma appears stable by my read, no mass effect seen, however formal report is not yet available. She reports being symptom-free for the past 1-1/2 years until 07/11/15 while standing in line at a reunion, her surroundings suddenly started moving. Symptoms settled down when she sat down, so she ate but then vertigo got worse and within 15 minutes she could not move her head without significant vertigo. Symptoms were "violent" for a couple of hours, she had to keep her eyes closed. She had a "halo headache" for a week or so and still felt dizzy for a couple of days. She did have a respiratory viral infection the week prior, and reports that since the initial dizzy spell, she has been more sensitive to viruses. She denies any tinnitus or hearing loss. No head injuries. She denies any falls. She has not had any major headaches, no diplopia or focal numbness/tingling/weakness. She wonders about her left maxillary sinus, which has been bothering her recently.   HPI: This is a very pleasant 55 yo RH woman with a history of hypothyroidism and anxiety, in her usual state of health until 03/26/2013 when after running 1/2 mile to the house because the bus came early for her daughter, she recalls feeling out of breath, then turned around and could not walk straight. She kept pulling to the right, she felt sick, and had to sit down with her eyes closed because of violent spinning. This lasted 30 minutes, she had to lay down flat on the floor of her garage until  symptoms improved an hour later. She continued to feel sick the rest of the day. She was feeling fine the next day, however 2 days later she started having a throbbing pain on the right temple and around her right eye. This resolved and was followed by a constant diffuse head pressure for 10 days. She also had dizziness that weekend, and the week after woke up with a "swishing" inside her head, "like water sloshing back and forth." She had an MRI brain with and without contrast and MRA head which I personally reviewed, no acute abnormalities seen. There is an incidental finding of a left frontal extra-axial dural based enhancing 1.4 x 1.2 x 0.8 cm lesion consistent with a small meningioma without significant mass effect on the adjacent brain parenchyma. She was started on a tapering course of prednisone, which helped significantly with the dizziness. She continued to have headaches, and when the prednisone taper finished, she started having brief minor dizzy spells lasting 15 seconds. These can occur when turning or bending down, but have also occurred while just standing cutting peppers at the counter. She reports symptoms are worse when she wakes up in the morning and gets out of bed, but if she wakes up in the middle of the night, she does not feel unsteady. The headaches resolved after the second course of Prednisone, however 4 days ago she again had throbbing over the right temporal region with tenderness to palpation, followed by diffuse mild pressure.  Diagnostic Data: MRI brain and MRA head unremarkable except for incidental finding of small left frontal meningioma without mass effect (stable on repeat imaging in 2017). Her neurological exam was normal. ESR and CRP normal.   PAST MEDICAL HISTORY: Past Medical History:  Diagnosis Date  . Anxiety   . Arthritis   . Constipation   . Constipation   . Diverticulitis   . Fatigue   . Hypothyroid   . Nephrolithiasis   . Seasonal allergies   .  Thyroid disease     MEDICATIONS: Current Outpatient Prescriptions on File Prior to Visit  Medication Sig Dispense Refill  . DENTA 5000 PLUS 1.1 % CREA dental cream Take 1 application by mouth daily.    Marland Kitchen doxylamine, Sleep, (SLEEP AID) 25 MG tablet Take 25 mg by mouth at bedtime as needed.    Marland Kitchen levothyroxine (SYNTHROID, LEVOTHROID) 50 MCG tablet Take 50 mcg by mouth daily before breakfast.     No current facility-administered medications on file prior to visit.     ALLERGIES: No Known Allergies  FAMILY HISTORY: Family History  Problem Relation Age of Onset  . Thyroid cancer Mother   . Breast cancer Mother   . Skin cancer Mother   . Atrial fibrillation Mother   . Hypertension Mother   . Melanoma Father     SOCIAL HISTORY: Social History   Social History  . Marital status: Married    Spouse name: N/A  . Number of children: N/A  . Years of education: N/A   Occupational History  . Not on file.   Social History Main Topics  . Smoking status: Never Smoker  . Smokeless tobacco: Not on file  . Alcohol use Yes     Comment: rarely  . Drug use: No  . Sexual activity: Not on file   Other Topics Concern  . Not on file   Social History Narrative  . No narrative on file    REVIEW OF SYSTEMS: Constitutional: No fevers, chills, or sweats, no generalized fatigue, change in appetite Eyes: No visual changes, double vision, eye pain Ear, nose and throat: No hearing loss, ear pain, nasal congestion, sore throat Cardiovascular: No chest pain, palpitations Respiratory:  No shortness of breath at rest or with exertion, wheezes GastrointestinaI: No nausea, vomiting, diarrhea, abdominal pain, fecal incontinence Genitourinary:  No dysuria, urinary retention or frequency Musculoskeletal:  No neck pain, back pain Integumentary: No rash, pruritus, skin lesions Neurological: as above Psychiatric: No depression, insomnia, anxiety Endocrine: No palpitations, fatigue, diaphoresis, mood  swings, change in appetite, change in weight, increased thirst Hematologic/Lymphatic:  No anemia, purpura, petechiae. Allergic/Immunologic: no itchy/runny eyes, nasal congestion, recent allergic reactions, rashes  PHYSICAL EXAM: Vitals:   09/08/15 1528  BP: 106/74  Pulse: 77  Temp: 98.3 F (36.8 C)   General: No acute distress Head:  Normocephalic/atraumatic Neck: supple, no paraspinal tenderness, full range of motion Heart:  Regular rate and rhythm Lungs:  Clear to auscultation bilaterally Back: No paraspinal tenderness Skin/Extremities: No rash, no edema Neurological Exam: alert and oriented to person, place, and time. No aphasia or dysarthria. Fund of knowledge is appropriate.  Recent and remote memory are intact. 3/3 delayed recall.  Attention and concentration are normal.    Able to name objects and repeat phrases. Cranial nerves: Pupils equal, round, reactive to light.  Fundoscopic exam unremarkable, no papilledema. Extraocular movements intact with no nystagmus. Visual fields full. Facial sensation intact. No facial asymmetry. Tongue, uvula, palate midline.  Motor: Bulk and tone  normal, muscle strength 5/5 throughout with no pronator drift.  Sensation to light touch intact.  No extinction to double simultaneous stimulation.  Deep tendon reflexes 2+ throughout, toes downgoing.  Finger to nose testing intact.  Gait narrow-based and steady, able to tandem walk adequately.  Romberg negative.  IMPRESSION: This is a very pleasant 55 yo RH woman with a history of hypothyroidism and anxiety who presented with new onset dizziness and head pressure. This has resolved without recurrence since September 2015. In the past year, she has had one episode of vertigo that lasted for several hours and took several days to recover. This occurred a week after a viral infection, possibly vestibular neuritis. She denies hearing loss or vertigo, but will discuss symptoms with her ENT Dr. Janace Hoard, as well as her  concerns for left maxillary sinusitis. She was given a prescription for prn meclizine 12.22m for vertigo, side effects were discussed. Her neurological exam is normal, brain imaging unremarkable except for incidental finding of small left frontal meningioma that will be followed annually for now. She will follow-up on an annual basis to follow-up on meningioma, and knows to call our office for any problems.   Thank you for allowing me to participate in her care.  Please do not hesitate to call for any questions or concerns.  The duration of this appointment visit was 15 minutes of face-to-face time with the patient.  Greater than 50% of this time was spent in counseling, explanation of diagnosis, planning of further management, and coordination of care.   KEllouise Newer M.D.   CC: Dr. STamala Julian Dr. JMelissa Montane  ADDENDUM 09/14/15: Received MRI report, meningioma is stable 1.3 x 0.7 x 0.9 cm, no mass effect, no additional lesions.

## 2015-09-08 NOTE — Patient Instructions (Signed)
You look great. Follow-up in 1 year for meningioma follow-up. Please call our office a month prior to schedule MRI brain with and without contrast. Let us know when your appointment with Dr. Janace Hoard will be and we will send your records.   Have a great year!

## 2016-02-08 DIAGNOSIS — Z13820 Encounter for screening for osteoporosis: Secondary | ICD-10-CM | POA: Diagnosis not present

## 2016-02-15 DIAGNOSIS — M8588 Other specified disorders of bone density and structure, other site: Secondary | ICD-10-CM | POA: Diagnosis not present

## 2016-02-15 DIAGNOSIS — M19041 Primary osteoarthritis, right hand: Secondary | ICD-10-CM | POA: Diagnosis not present

## 2016-02-15 DIAGNOSIS — M19042 Primary osteoarthritis, left hand: Secondary | ICD-10-CM | POA: Diagnosis not present

## 2016-02-22 DIAGNOSIS — R05 Cough: Secondary | ICD-10-CM | POA: Diagnosis not present

## 2016-02-22 DIAGNOSIS — R591 Generalized enlarged lymph nodes: Secondary | ICD-10-CM | POA: Diagnosis not present

## 2016-03-07 DIAGNOSIS — M79643 Pain in unspecified hand: Secondary | ICD-10-CM | POA: Diagnosis not present

## 2016-03-07 DIAGNOSIS — M19041 Primary osteoarthritis, right hand: Secondary | ICD-10-CM | POA: Diagnosis not present

## 2016-03-07 DIAGNOSIS — I7300 Raynaud's syndrome without gangrene: Secondary | ICD-10-CM | POA: Diagnosis not present

## 2016-03-07 DIAGNOSIS — M1990 Unspecified osteoarthritis, unspecified site: Secondary | ICD-10-CM | POA: Diagnosis not present

## 2016-03-17 DIAGNOSIS — M8580 Other specified disorders of bone density and structure, unspecified site: Secondary | ICD-10-CM | POA: Diagnosis not present

## 2016-03-17 DIAGNOSIS — M79643 Pain in unspecified hand: Secondary | ICD-10-CM | POA: Diagnosis not present

## 2016-03-17 DIAGNOSIS — I7300 Raynaud's syndrome without gangrene: Secondary | ICD-10-CM | POA: Diagnosis not present

## 2016-03-17 DIAGNOSIS — M1990 Unspecified osteoarthritis, unspecified site: Secondary | ICD-10-CM | POA: Diagnosis not present

## 2016-03-31 DIAGNOSIS — H40013 Open angle with borderline findings, low risk, bilateral: Secondary | ICD-10-CM | POA: Diagnosis not present

## 2016-05-03 DIAGNOSIS — E039 Hypothyroidism, unspecified: Secondary | ICD-10-CM | POA: Diagnosis not present

## 2016-05-10 DIAGNOSIS — E039 Hypothyroidism, unspecified: Secondary | ICD-10-CM | POA: Diagnosis not present

## 2016-05-25 DIAGNOSIS — D2262 Melanocytic nevi of left upper limb, including shoulder: Secondary | ICD-10-CM | POA: Diagnosis not present

## 2016-05-25 DIAGNOSIS — D2261 Melanocytic nevi of right upper limb, including shoulder: Secondary | ICD-10-CM | POA: Diagnosis not present

## 2016-05-25 DIAGNOSIS — L821 Other seborrheic keratosis: Secondary | ICD-10-CM | POA: Diagnosis not present

## 2016-08-21 DIAGNOSIS — R591 Generalized enlarged lymph nodes: Secondary | ICD-10-CM | POA: Diagnosis not present

## 2016-09-08 ENCOUNTER — Encounter: Payer: Self-pay | Admitting: Neurology

## 2016-09-08 ENCOUNTER — Ambulatory Visit (INDEPENDENT_AMBULATORY_CARE_PROVIDER_SITE_OTHER): Payer: 59 | Admitting: Neurology

## 2016-09-08 VITALS — BP 114/60 | HR 80 | Ht 65.5 in | Wt 143.0 lb

## 2016-09-08 DIAGNOSIS — D329 Benign neoplasm of meninges, unspecified: Secondary | ICD-10-CM | POA: Diagnosis not present

## 2016-09-08 DIAGNOSIS — R42 Dizziness and giddiness: Secondary | ICD-10-CM

## 2016-09-08 NOTE — Patient Instructions (Signed)
Great seeing you! Schedule MRI brain with and without contrast. Follow-up as needed, call for any changes. Take care!

## 2016-09-08 NOTE — Progress Notes (Signed)
NEUROLOGY FOLLOW UP OFFICE NOTE  Brooke Green [id number]  HISTORY OF PRESENT ILLNESS: I had the pleasure of seeing Brooke Green in follow-up in the neurology clinic on 09/08/2016.  The patient was last seen a year ago for dizziness and head pressure. As part of her workup, an incidental finding of a small left frontal meningioma without mass effect was seen. MRI brain with and without contrast in August 2017 showed stable findings, no new changes seen. She has not had any further episodes of vertigo and has not needed prn meclizine. She had some bad headaches while on doxycycline. She has occasional numbness in her right arm while sitting in a movie theater, resolves when she shakes it. She denies any diplopia, dysarthria/dysphagia, bowel/bladder dysfunction, no falls.   HPI: This is a very pleasant 56 yo RH woman with a history of hypothyroidism and anxiety, in her usual state of health until 03/26/2013 when after running 1/2 mile to the house because the bus came early for her daughter, she recalls feeling out of breath, then turned around and could not walk straight. She kept pulling to the right, she felt sick, and had to sit down with her eyes closed because of violent spinning. This lasted 30 minutes, she had to lay down flat on the floor of her garage until symptoms improved an hour later. She continued to feel sick the rest of the day. She was feeling fine the next day, however 2 days later she started having a throbbing pain on the right temple and around her right eye. This resolved and was followed by a constant diffuse head pressure for 10 days. She also had dizziness that weekend, and the week after woke up with a "swishing" inside her head, "like water sloshing back and forth." She had an MRI brain with and without contrast and MRA head which I personally reviewed, no acute abnormalities seen. There is an incidental finding of a left frontal extra-axial dural based enhancing 1.4 x 1.2 x 0.8  cm lesion consistent with a small meningioma without significant mass effect on the adjacent brain parenchyma. She was started on a tapering course of prednisone, which helped significantly with the dizziness. She continued to have headaches, and when the prednisone taper finished, she started having brief minor dizzy spells lasting 15 seconds. These can occur when turning or bending down, but have also occurred while just standing cutting peppers at the counter. She reports symptoms are worse when she wakes up in the morning and gets out of bed, but if she wakes up in the middle of the night, she does not feel unsteady. The headaches resolved after the second course of Prednisone, however 4 days ago she again had throbbing over the right temporal region with tenderness to palpation, followed by diffuse mild pressure.   Diagnostic Data: MRI brain and MRA head unremarkable except for incidental finding of small left frontal meningioma without mass effect (stable on repeat imaging in 2017). Her neurological exam was normal. ESR and CRP normal.   PAST MEDICAL HISTORY: Past Medical History:  Diagnosis Date  . Anxiety   . Arthritis   . Constipation   . Constipation   . Diverticulitis   . Fatigue   . Hypothyroid   . Nephrolithiasis   . Seasonal allergies   . Thyroid disease     MEDICATIONS: Current Outpatient Prescriptions on File Prior to Visit  Medication Sig Dispense Refill  . cetirizine (ZYRTEC) 10 MG tablet Take 10 mg by  mouth daily.    . DENTA 5000 PLUS 1.1 % CREA dental cream Take 1 application by mouth daily.    Marland Kitchen levothyroxine (SYNTHROID, LEVOTHROID) 50 MCG tablet Take 50 mcg by mouth daily before breakfast.    . meclizine (ANTIVERT) 12.5 MG tablet Take 1 tablet (12.5 mg total) by mouth 3 (three) times daily as needed for dizziness. 30 tablet 5   No current facility-administered medications on file prior to visit.     ALLERGIES: No Known Allergies  FAMILY HISTORY: Family  History  Problem Relation Age of Onset  . Thyroid cancer Mother   . Breast cancer Mother   . Skin cancer Mother   . Atrial fibrillation Mother   . Hypertension Mother   . Melanoma Father     SOCIAL HISTORY: Social History   Social History  . Marital status: Married    Spouse name: N/A  . Number of children: N/A  . Years of education: N/A   Occupational History  . Not on file.   Social History Main Topics  . Smoking status: Never Smoker  . Smokeless tobacco: Not on file  . Alcohol use Yes     Comment: rarely  . Drug use: No  . Sexual activity: Not on file   Other Topics Concern  . Not on file   Social History Narrative  . No narrative on file    REVIEW OF SYSTEMS: Constitutional: No fevers, chills, or sweats, no generalized fatigue, change in appetite Eyes: No visual changes, double vision, eye pain Ear, nose and throat: No hearing loss, ear pain, nasal congestion, sore throat Cardiovascular: No chest pain, palpitations Respiratory:  No shortness of breath at rest or with exertion, wheezes GastrointestinaI: No nausea, vomiting, diarrhea, abdominal pain, fecal incontinence Genitourinary:  No dysuria, urinary retention or frequency Musculoskeletal:  No neck pain, back pain Integumentary: No rash, pruritus, skin lesions Neurological: as above Psychiatric: No depression, insomnia, anxiety Endocrine: No palpitations, fatigue, diaphoresis, mood swings, change in appetite, change in weight, increased thirst Hematologic/Lymphatic:  No anemia, purpura, petechiae. Allergic/Immunologic: no itchy/runny eyes, nasal congestion, recent allergic reactions, rashes  PHYSICAL EXAM: Vitals:   09/08/16 1023  BP: 114/60  Pulse: 80  SpO2: 98%   General: No acute distress Head:  Normocephalic/atraumatic Neck: supple, no paraspinal tenderness, full range of motion Heart:  Regular rate and rhythm Lungs:  Clear to auscultation bilaterally Back: No paraspinal  tenderness Skin/Extremities: No rash, no edema Neurological Exam: alert and oriented to person, place, and time. No aphasia or dysarthria. Fund of knowledge is appropriate.  Recent and remote memory are intact. Attention and concentration are normal.    Able to name objects and repeat phrases. Cranial nerves: Pupils equal, round, reactive to light.  Extraocular movements intact with no nystagmus. Visual fields full. Facial sensation intact. No facial asymmetry. Tongue, uvula, palate midline.  Motor: Bulk and tone normal, muscle strength 5/5 throughout with no pronator drift.  Sensation to light touch intact.  No extinction to double simultaneous stimulation.  Deep tendon reflexes 2+ throughout, toes downgoing.  Finger to nose testing intact.  Gait narrow-based and steady, able to tandem walk adequately.  Romberg negative.  IMPRESSION: This is a very pleasant 56 yo RH woman with a history of hypothyroidism and anxiety who presented with new onset dizziness and head pressure. This has resolved without recurrence since September 2015. She denies any vertigo in the past year. Her neurological exam is normal, brain imaging unremarkable except for incidental finding of small left  frontal meningioma, stable on repeat imaging last August 2017. A follow-up interval MRI brain with and without contrast will be ordered. If unchanged, repeat imaging can be done in 5 years, or if any change in symptoms occur before then. She would like to follow-up on a prn basis and knows to call our office for any changes.   Thank you for allowing me to participate in her care.  Please do not hesitate to call for any questions or concerns.  The duration of this appointment visit was 15 minutes of face-to-face time with the patient.  Greater than 50% of this time was spent in counseling, explanation of diagnosis, planning of further management, and coordination of care.   Ellouise Newer, M.D.   CC: Dr. Tamala Julian

## 2016-09-08 NOTE — Addendum Note (Signed)
Addended by: Lenny Pastel on: 09/08/2016 02:42 PM   Modules accepted: Orders

## 2016-09-18 ENCOUNTER — Telehealth: Payer: Self-pay | Admitting: Neurology

## 2016-09-18 NOTE — Telephone Encounter (Signed)
Pt called and said she has not heard from anyone regarding setting up an appointment for her MRI

## 2016-09-19 NOTE — Telephone Encounter (Signed)
Referral re-faxed to [facility]

## 2016-09-26 ENCOUNTER — Encounter: Payer: Self-pay | Admitting: Neurology

## 2016-09-26 DIAGNOSIS — D320 Benign neoplasm of cerebral meninges: Secondary | ICD-10-CM | POA: Diagnosis not present

## 2016-09-27 ENCOUNTER — Telehealth: Payer: Self-pay

## 2016-09-27 NOTE — Telephone Encounter (Signed)
Nothing due to the brain. Would speak to her PCP about it, maybe see a dermatologist?

## 2016-09-27 NOTE — Telephone Encounter (Signed)
-----   Message from Cameron Sprang, MD sent at 09/27/2016 10:58 AM EDT ----- Regarding: MRI results Pls let her know MRI brain looks great, no changes in the meningioma, it is very small. Call for any changes in symptoms. Thanks

## 2016-09-27 NOTE — Telephone Encounter (Signed)
Spoke with pt relaying message below.  She mentioned that the lower left side of her skull is still itchy and tender and is wondering if Dr Delice Lesch might have seen anything on the MRI about this?

## 2016-10-03 DIAGNOSIS — H40013 Open angle with borderline findings, low risk, bilateral: Secondary | ICD-10-CM | POA: Diagnosis not present

## 2016-12-21 ENCOUNTER — Other Ambulatory Visit: Payer: Self-pay | Admitting: Neurology

## 2016-12-21 ENCOUNTER — Telehealth: Payer: Self-pay | Admitting: Neurology

## 2016-12-21 DIAGNOSIS — R42 Dizziness and giddiness: Secondary | ICD-10-CM

## 2016-12-21 NOTE — Telephone Encounter (Signed)
Patient Lmom regarding a medication that cannot be filled. She also said she is having new symptoms in the last year. Please Call. Thanks

## 2016-12-22 MED ORDER — MECLIZINE HCL 12.5 MG PO TABS: 12.5000 mg | ORAL_TABLET | Freq: Three times a day (TID) | ORAL | 6 refills | Status: AC | PRN

## 2016-12-22 NOTE — Telephone Encounter (Addendum)
Spoke with pt.  She states that about 2-3 weeks ago she started experiencing her vertigo symptoms again.  This past week alone she has had vertigo x 4days.  States that in the past she had not needed Meclizine, so she never took it.  States that she does have some expired on hand that she has taken, and it is working for her.  Let pt know I would talk to Dr. Delice Lesch and we will most likely send Rx.  Verified pt's preferred pharmacy.  Pt states OK to leave detailed message on voicemail in regards to this.

## 2017-01-10 DIAGNOSIS — J069 Acute upper respiratory infection, unspecified: Secondary | ICD-10-CM | POA: Diagnosis not present

## 2017-01-10 DIAGNOSIS — R42 Dizziness and giddiness: Secondary | ICD-10-CM | POA: Diagnosis not present

## 2017-02-20 DIAGNOSIS — J0100 Acute maxillary sinusitis, unspecified: Secondary | ICD-10-CM | POA: Diagnosis not present

## 2017-03-01 DIAGNOSIS — Z01419 Encounter for gynecological examination (general) (routine) without abnormal findings: Secondary | ICD-10-CM | POA: Diagnosis not present

## 2017-03-01 DIAGNOSIS — Z6823 Body mass index (BMI) 23.0-23.9, adult: Secondary | ICD-10-CM | POA: Diagnosis not present

## 2017-03-20 DIAGNOSIS — M1990 Unspecified osteoarthritis, unspecified site: Secondary | ICD-10-CM | POA: Diagnosis not present

## 2017-03-20 DIAGNOSIS — M79643 Pain in unspecified hand: Secondary | ICD-10-CM | POA: Diagnosis not present

## 2017-03-20 DIAGNOSIS — I7300 Raynaud's syndrome without gangrene: Secondary | ICD-10-CM | POA: Diagnosis not present

## 2017-04-03 DIAGNOSIS — Z83511 Family history of glaucoma: Secondary | ICD-10-CM | POA: Diagnosis not present

## 2017-04-03 DIAGNOSIS — H40053 Ocular hypertension, bilateral: Secondary | ICD-10-CM | POA: Diagnosis not present

## 2017-04-03 DIAGNOSIS — H40013 Open angle with borderline findings, low risk, bilateral: Secondary | ICD-10-CM | POA: Diagnosis not present

## 2017-05-03 DIAGNOSIS — E039 Hypothyroidism, unspecified: Secondary | ICD-10-CM | POA: Diagnosis not present

## 2017-05-10 DIAGNOSIS — Z808 Family history of malignant neoplasm of other organs or systems: Secondary | ICD-10-CM | POA: Diagnosis not present

## 2017-05-10 DIAGNOSIS — E039 Hypothyroidism, unspecified: Secondary | ICD-10-CM | POA: Diagnosis not present

## 2017-05-11 ENCOUNTER — Other Ambulatory Visit: Payer: Self-pay | Admitting: Endocrinology

## 2017-05-11 DIAGNOSIS — Z808 Family history of malignant neoplasm of other organs or systems: Secondary | ICD-10-CM

## 2017-05-17 ENCOUNTER — Ambulatory Visit
Admission: RE | Admit: 2017-05-17 | Discharge: 2017-05-17 | Disposition: A | Discharge status: Home / Self Care | Payer: 59 | Source: Ambulatory Visit | Attending: Endocrinology | Admitting: Endocrinology

## 2017-05-17 DIAGNOSIS — Z808 Family history of malignant neoplasm of other organs or systems: Secondary | ICD-10-CM

## 2017-05-18 DIAGNOSIS — E041 Nontoxic single thyroid nodule: Secondary | ICD-10-CM | POA: Diagnosis not present

## 2017-05-21 ENCOUNTER — Other Ambulatory Visit: Payer: Self-pay | Admitting: Endocrinology

## 2017-05-21 DIAGNOSIS — E041 Nontoxic single thyroid nodule: Secondary | ICD-10-CM

## 2017-06-12 DIAGNOSIS — J019 Acute sinusitis, unspecified: Secondary | ICD-10-CM | POA: Diagnosis not present

## 2017-06-14 ENCOUNTER — Ambulatory Visit
Admission: RE | Admit: 2017-06-14 | Discharge: 2017-06-14 | Disposition: A | Discharge status: Home / Self Care | Payer: 59 | Source: Ambulatory Visit | Attending: Endocrinology | Admitting: Endocrinology

## 2017-06-14 ENCOUNTER — Other Ambulatory Visit (HOSPITAL_COMMUNITY)
Admission: RE | Admit: 2017-06-14 | Discharge: 2017-06-14 | Disposition: A | Discharge status: Home / Self Care | Payer: 59 | Source: Ambulatory Visit | Attending: Radiology | Admitting: Radiology

## 2017-06-14 DIAGNOSIS — E042 Nontoxic multinodular goiter: Secondary | ICD-10-CM | POA: Diagnosis not present

## 2017-06-14 DIAGNOSIS — E041 Nontoxic single thyroid nodule: Secondary | ICD-10-CM

## 2017-06-14 DIAGNOSIS — E079 Disorder of thyroid, unspecified: Secondary | ICD-10-CM | POA: Diagnosis not present

## 2017-06-14 DIAGNOSIS — C73 Malignant neoplasm of thyroid gland: Secondary | ICD-10-CM | POA: Diagnosis not present

## 2017-06-14 DIAGNOSIS — D440 Neoplasm of uncertain behavior of thyroid gland: Secondary | ICD-10-CM | POA: Diagnosis not present

## 2017-07-11 ENCOUNTER — Ambulatory Visit: Payer: Self-pay | Admitting: Surgery

## 2017-07-11 DIAGNOSIS — C73 Malignant neoplasm of thyroid gland: Secondary | ICD-10-CM | POA: Diagnosis not present

## 2017-08-01 NOTE — Patient Instructions (Signed)
JENENE KAUFFMANN  08/01/2017   Your procedure is scheduled on: 08/16/2017   Report to [facility] Main  Entrance  Report to admitting at   0730 AM    Call this number if you have problems the morning of surgery [phone]   Remember: Do not eat food or drink liquids :After Midnight.     Take these medicines the morning of surgery with A SIP OF WATER: Synthroid, Flonase                                 You may not have any metal on your body including hair pins and              piercings  Do not wear jewelry, make-up, lotions, powders or perfumes, deodorant             Do not wear nail polish.  Do not shave  48 hours prior to surgery.                Do not bring valuables to the hospital. [place].  Contacts, dentures or bridgework may not be worn into surgery.  Leave suitcase in the car. After surgery it may be brought to your room.                   Please read over the following fact sheets you were given: _____________________________________________________________________             [facility] - Preparing for Surgery Before surgery, you can play an important role.  Because skin is not sterile, your skin needs to be as free of germs as possible.  You can reduce the number of germs on your skin by washing with CHG (chlorahexidine gluconate) soap before surgery.  CHG is an antiseptic cleaner which kills germs and bonds with the skin to continue killing germs even after washing. Please DO NOT use if you have an allergy to CHG or antibacterial soaps.  If your skin becomes reddened/irritated stop using the CHG and inform your nurse when you arrive at Short Stay. Do not shave (including legs and underarms) for at least 48 hours prior to the first CHG shower.  You may shave your face/neck. Please follow these instructions carefully:  1.  Shower with CHG Soap the night before surgery and the  morning of  Surgery.  2.  If you choose to wash your hair, wash your hair first as usual with your  normal  shampoo.  3.  After you shampoo, rinse your hair and body thoroughly to remove the  shampoo.                           4.  Use CHG as you would any other liquid soap.  You can apply chg directly  to the skin and wash                       Gently with a scrungie or clean washcloth.  5.  Apply the CHG Soap to your body ONLY FROM THE NECK DOWN.   Do not use on face/ open  Wound or open sores. Avoid contact with eyes, ears mouth and genitals (private parts).                       Wash face,  Genitals (private parts) with your normal soap.             6.  Wash thoroughly, paying special attention to the area where your surgery  will be performed.  7.  Thoroughly rinse your body with warm water from the neck down.  8.  DO NOT shower/wash with your normal soap after using and rinsing off  the CHG Soap.                9.  Pat yourself dry with a clean towel.            10.  Wear clean pajamas.            11.  Place clean sheets on your bed the night of your first shower and do not  sleep with pets. Day of Surgery : Do not apply any lotions/deodorants the morning of surgery.  Please wear clean clothes to the hospital/surgery center.  FAILURE TO FOLLOW THESE INSTRUCTIONS MAY RESULT IN THE CANCELLATION OF YOUR SURGERY PATIENT SIGNATURE_________________________________  NURSE SIGNATURE__________________________________  ________________________________________________________________________

## 2017-08-02 ENCOUNTER — Encounter (HOSPITAL_COMMUNITY)
Admission: RE | Admit: 2017-08-02 | Discharge: 2017-08-02 | Disposition: A | Discharge status: Home / Self Care | Payer: 59 | Source: Ambulatory Visit | Attending: Surgery | Admitting: Surgery

## 2017-08-02 ENCOUNTER — Ambulatory Visit (HOSPITAL_COMMUNITY)
Admission: RE | Admit: 2017-08-02 | Discharge: 2017-08-02 | Disposition: A | Discharge status: Home / Self Care | Payer: 59 | Source: Ambulatory Visit | Attending: Anesthesiology | Admitting: Anesthesiology

## 2017-08-02 ENCOUNTER — Encounter (HOSPITAL_COMMUNITY): Payer: Self-pay

## 2017-08-02 ENCOUNTER — Other Ambulatory Visit: Payer: Self-pay

## 2017-08-02 DIAGNOSIS — C73 Malignant neoplasm of thyroid gland: Secondary | ICD-10-CM | POA: Diagnosis not present

## 2017-08-02 DIAGNOSIS — Z01818 Encounter for other preprocedural examination: Secondary | ICD-10-CM | POA: Diagnosis not present

## 2017-08-02 DIAGNOSIS — Z01812 Encounter for preprocedural laboratory examination: Secondary | ICD-10-CM | POA: Insufficient documentation

## 2017-08-02 HISTORY — DX: Malignant (primary) neoplasm, unspecified: C80.1

## 2017-08-02 HISTORY — DX: Cardiac arrhythmia, unspecified: I49.9

## 2017-08-02 LAB — CBC
HCT: 40.6 % (ref 36.0–46.0)
Hemoglobin: 13.5 g/dL (ref 12.0–15.0)
MCH: 29.5 pg (ref 26.0–34.0)
MCHC: 33.3 g/dL (ref 30.0–36.0)
MCV: 88.6 fL (ref 78.0–100.0)
Platelets: 172 10*3/uL (ref 150–400)
RBC: 4.58 MIL/uL (ref 3.87–5.11)
RDW: 13.2 % (ref 11.5–15.5)
WBC: 4.4 10*3/uL (ref 4.0–10.5)

## 2017-08-12 ENCOUNTER — Encounter (HOSPITAL_COMMUNITY): Payer: Self-pay | Admitting: Surgery

## 2017-08-12 DIAGNOSIS — C73 Malignant neoplasm of thyroid gland: Secondary | ICD-10-CM | POA: Diagnosis present

## 2017-08-12 NOTE — H&P (Signed)
General Surgery [facility]  Brooke Green DOB: 1960-09-15 Married / Language: English / Race: White Female   History of Present Illness   The patient is a 57 year old female who presents with thyroid cancer.  CHIEF COMPLAINT: papillary thyroid carcinoma  Patient is referred by Dr. Jacelyn Pi for surgical evaluation and management of papillary thyroid carcinoma. Patient's primary care physician is Dr. Carol Ada. Patient had presented to her primary physician with complaints of mild dysphagia. This had occurred over the past 1-2 years. Patient was sent for an ultrasound on May 18, 2017. This demonstrated a normal size thyroid gland with 2 dominant nodules in the right thyroid lobe measuring 1.2 cm and 1.1 cm. Biopsy was recommended on each nodule. Biopsies were subsequently performed on June 14, 2017. Each of these showed evidence of papillary thyroid carcinoma. Patient does have a family history of thyroid cancer in the patient's mother. She was treated with surgery and radioactive iodine. She is alive with no evidence of recurrent disease. Patient has a history of mild hypothyroidism. She has been on Synthroid 50 g daily for at least 15 years. Patient has had no other head or neck surgery. She has been evaluated by an ENT surgeon for a nodule in the right anterior cervical chain. This was followed for approximately 1 year. She did not have biopsy or surgical intervention. Patient denies any symptoms of tremor or palpitations. She presents today accompanied by her husband.   Past Surgical History Appendectomy  Cesarean Section - 1  Colon Polyp Removal - Colonoscopy   Diagnostic Studies History Colonoscopy  5-10 years ago Mammogram  within last year Pap Smear  1-5 years ago  Allergies No Known Allergies [07/11/2017]:  Medication History  Levothyroxine Sodium (50MCG Tablet, Oral) Active. Flonase (50MCG/ACT Suspension, Nasal)  Active. Sleep Aid (25MG  Tablet, Oral) Active. Antivert (12.5MG  Tablet, Oral) Active. Medications Reconciled  Social History Alcohol use  Occasional alcohol use. Caffeine use  Coffee. No drug use  Tobacco use  Never smoker.  Family History Arthritis  Mother. Breast Cancer  Mother. Cancer  Mother. Hypertension  Mother. Melanoma  Father. Thyroid problems  Mother.  Pregnancy / Birth History Age at menarche  33 years. Age of menopause  [age] Contraceptive History  Oral contraceptives. Gravida  3 Irregular periods  Length (months) of breastfeeding  7-12 Maternal age  73-30 Para  3  Other Problems Arthritis  Diverticulosis  Melanoma  Thyroid Cancer  Thyroid Disease   Review of Systems  General Not Present- Appetite Loss, Chills, Fatigue, Fever, Night Sweats, Weight Gain and Weight Loss. Skin Not Present- Change in Wart/Mole, Dryness, Hives, Jaundice, New Lesions, Non-Healing Wounds, Rash and Ulcer. HEENT Present- Earache, Seasonal Allergies, Sinus Pain and Wears glasses/contact lenses. Not Present- Hearing Loss, Hoarseness, Nose Bleed, Oral Ulcers, Ringing in the Ears, Sore Throat, Visual Disturbances and Yellow Eyes. Respiratory Not Present- Bloody sputum, Chronic Cough, Difficulty Breathing, Snoring and Wheezing. Breast Not Present- Breast Mass, Breast Pain, Nipple Discharge and Skin Changes. Cardiovascular Present- Leg Cramps, Palpitations and Rapid Heart Rate. Not Present- Chest Pain, Difficulty Breathing Lying Down, Shortness of Breath and Swelling of Extremities. Gastrointestinal Present- Constipation and Difficulty Swallowing. Not Present- Abdominal Pain, Bloating, Bloody Stool, Change in Bowel Habits, Chronic diarrhea, Excessive gas, Gets full quickly at meals, Hemorrhoids, Indigestion, Nausea, Rectal Pain and Vomiting. Female Genitourinary Not Present- Frequency, Nocturia, Painful Urination, Pelvic Pain and Urgency. Musculoskeletal Not Present-  Back Pain, Joint Pain, Joint Stiffness, Muscle Pain, Muscle  Weakness and Swelling of Extremities. Neurological Not Present- Decreased Memory, Fainting, Headaches, Numbness, Seizures, Tingling, Tremor, Trouble walking and Weakness. Psychiatric Not Present- Anxiety, Bipolar, Change in Sleep Pattern, Depression, Fearful and Frequent crying. Endocrine Not Present- Cold Intolerance, Excessive Hunger, Hair Changes, Heat Intolerance, Hot flashes and New Diabetes. Hematology Not Present- Blood Thinners, Easy Bruising, Excessive bleeding, Gland problems, HIV and Persistent Infections.  Vitals Weight: 140.8 lb Height: 66in Body Surface Area: 1.72 m Body Mass Index: 22.73 kg/m  Temp.: 97.83F(Temporal)  Pulse: 78 (Regular)  BP: 118/72 (Sitting, Right Arm, Standard)   Physical Exam  See vital signs recorded above  GENERAL APPEARANCE Development: normal Nutritional status: normal Gross deformities: none  SKIN Rash, lesions, ulcers: none Induration, erythema: none Nodules: none palpable  EYES Conjunctiva and lids: normal Pupils: equal and reactive Iris: normal bilaterally  EARS, NOSE, MOUTH, THROAT External ears: no lesion or deformity External nose: no lesion or deformity Hearing: grossly normal Lips: no lesion or deformity Dentition: normal for age Oral mucosa: moist  NECK Symmetric: yes Trachea: midline Thyroid: No palpable nodules in the left thyroid lobe. Palpation on the right thyroid lobe shows subtle nodularity in the mid and inferior portion of the lobe. These are mildly firm. There is no associated lymphadenopathy. There is no tenderness. There are prominent lymph nodes in the high right anterior cervical chain. These are soft and mobile and nontender.  CHEST Respiratory effort: normal Retraction or accessory muscle use: no Breath sounds: normal bilaterally Rales, rhonchi, wheeze: none  CARDIOVASCULAR Auscultation: regular rhythm, normal rate Murmurs:  none Pulses: carotid and radial pulse 2+ palpable Lower extremity edema: none Lower extremity varicosities: none  MUSCULOSKELETAL Station and gait: normal Digits and nails: no clubbing or cyanosis Muscle strength: grossly normal all extremities Range of motion: grossly normal all extremities Deformity: none  LYMPHATIC Cervical: none palpable Supraclavicular: none palpable  PSYCHIATRIC Oriented to person, place, and time: yes Mood and affect: normal for situation Judgment and insight: appropriate for situation    Assessment & Plan  PAPILLARY THYROID CARCINOMA (C73)  Pt Education - Pamphlet Given - The Thyroid Book: discussed with patient and provided information.  Patient presents today on referral from her endocrinologist and primary care physician for evaluation of newly diagnosed papillary thyroid carcinoma. She is accompanied by her husband. They are provided with written literature on thyroid surgery to review at home.  Patient has multifocal papillary thyroid carcinoma involving the right thyroid lobe. I have recommended proceeding with total thyroidectomy and limited central compartment left node dissection for definitive diagnosis and management. We have discussed the risk and benefits of the procedure including the potential for recurrent laryngeal nerve injury and injury to parathyroid glands. We have discussed the location of the surgical incision. We have discussed the hospital stay to be anticipated as well as the postoperative recovery. We have discussed the need for lifelong thyroid hormone replacement. We have discussed the potential need for radioactive iodine treatment. They understand and wish to proceed with surgery in the near future.  The risks and benefits of the procedure have been discussed at length with the patient. The patient understands the proposed procedure, potential alternative treatments, and the course of recovery to be expected. All of  the patient's questions have been answered at this time. The patient wishes to proceed with surgery.  Armandina Gemma, [facility] Office: [phone]

## 2017-08-15 NOTE — Anesthesia Preprocedure Evaluation (Addendum)
Anesthesia Evaluation  Patient identified by MRN, date of birth, ID band Patient awake    Reviewed: Allergy & Precautions, NPO status , Patient's Chart, lab work & pertinent test results  History of Anesthesia Complications (+) PONVNegative for: history of anesthetic complications  Airway Mallampati: I  TM Distance: >3 FB Neck ROM: Full    Dental  (+) Teeth Intact, Dental Advisory Given   Pulmonary neg pulmonary ROS,    breath sounds clear to auscultation       Cardiovascular negative cardio ROS   Rhythm:Regular Rate:Normal     Neuro/Psych Anxiety negative neurological ROS     GI/Hepatic negative GI ROS, Neg liver ROS,   Endo/Other  Hypothyroidism Papillary thyroid cancer  Renal/GU negative Renal ROS  negative genitourinary   Musculoskeletal negative musculoskeletal ROS (+)   Abdominal   Peds  Hematology negative hematology ROS (+)   Anesthesia Other Findings   Reproductive/Obstetrics negative OB ROS                            Anesthesia Physical Anesthesia Plan  ASA: II  Anesthesia Plan: General   Post-op Pain Management:    Induction: Intravenous  PONV Risk Score and Plan: 2 and Dexamethasone, Ondansetron, Midazolam and Scopolamine patch - Pre-op  Airway Management Planned: Oral ETT  Additional Equipment:   Intra-op Plan:   Post-operative Plan: Extubation in OR  Informed Consent: I have reviewed the patients History and Physical, chart, labs and discussed the procedure including the risks, benefits and alternatives for the proposed anesthesia with the patient or authorized representative who has indicated his/her understanding and acceptance.   Dental advisory given  Plan Discussed with: CRNA  Anesthesia Plan Comments:       Anesthesia Quick Evaluation

## 2017-08-16 ENCOUNTER — Ambulatory Visit (HOSPITAL_COMMUNITY): Payer: 59 | Admitting: Anesthesiology

## 2017-08-16 ENCOUNTER — Encounter (HOSPITAL_COMMUNITY)
Admission: RE | Disposition: A | Discharge status: Home / Self Care | Payer: Self-pay | Source: Other Acute Inpatient Hospital | Attending: Surgery

## 2017-08-16 ENCOUNTER — Observation Stay (HOSPITAL_COMMUNITY)
Admission: RE | Admit: 2017-08-16 | Discharge: 2017-08-17 | Disposition: A | Discharge status: Home / Self Care | Payer: 59 | Source: Other Acute Inpatient Hospital | Attending: Surgery | Admitting: Surgery

## 2017-08-16 ENCOUNTER — Other Ambulatory Visit: Payer: Self-pay

## 2017-08-16 ENCOUNTER — Encounter (HOSPITAL_COMMUNITY): Payer: Self-pay | Admitting: *Deleted

## 2017-08-16 DIAGNOSIS — Z85820 Personal history of malignant melanoma of skin: Secondary | ICD-10-CM | POA: Insufficient documentation

## 2017-08-16 DIAGNOSIS — K5790 Diverticulosis of intestine, part unspecified, without perforation or abscess without bleeding: Secondary | ICD-10-CM | POA: Insufficient documentation

## 2017-08-16 DIAGNOSIS — M1990 Unspecified osteoarthritis, unspecified site: Secondary | ICD-10-CM | POA: Diagnosis not present

## 2017-08-16 DIAGNOSIS — Z79899 Other long term (current) drug therapy: Secondary | ICD-10-CM | POA: Insufficient documentation

## 2017-08-16 DIAGNOSIS — E039 Hypothyroidism, unspecified: Secondary | ICD-10-CM | POA: Insufficient documentation

## 2017-08-16 DIAGNOSIS — C770 Secondary and unspecified malignant neoplasm of lymph nodes of head, face and neck: Secondary | ICD-10-CM | POA: Insufficient documentation

## 2017-08-16 DIAGNOSIS — Z881 Allergy status to other antibiotic agents status: Secondary | ICD-10-CM | POA: Insufficient documentation

## 2017-08-16 DIAGNOSIS — R1310 Dysphagia, unspecified: Secondary | ICD-10-CM | POA: Insufficient documentation

## 2017-08-16 DIAGNOSIS — Z8249 Family history of ischemic heart disease and other diseases of the circulatory system: Secondary | ICD-10-CM | POA: Insufficient documentation

## 2017-08-16 DIAGNOSIS — C73 Malignant neoplasm of thyroid gland: Principal | ICD-10-CM | POA: Diagnosis present

## 2017-08-16 DIAGNOSIS — Z8349 Family history of other endocrine, nutritional and metabolic diseases: Secondary | ICD-10-CM | POA: Diagnosis not present

## 2017-08-16 DIAGNOSIS — F419 Anxiety disorder, unspecified: Secondary | ICD-10-CM | POA: Insufficient documentation

## 2017-08-16 DIAGNOSIS — Z803 Family history of malignant neoplasm of breast: Secondary | ICD-10-CM | POA: Diagnosis not present

## 2017-08-16 DIAGNOSIS — Z8261 Family history of arthritis: Secondary | ICD-10-CM | POA: Diagnosis not present

## 2017-08-16 DIAGNOSIS — Z809 Family history of malignant neoplasm, unspecified: Secondary | ICD-10-CM | POA: Insufficient documentation

## 2017-08-16 HISTORY — PX: THYROIDECTOMY: SHX17

## 2017-08-16 SURGERY — THYROIDECTOMY
Anesthesia: General | Site: Neck

## 2017-08-16 MED ORDER — HYDROCODONE-ACETAMINOPHEN 5-325 MG PO TABS: 1.0000 | ORAL_TABLET | ORAL | Status: DC | PRN

## 2017-08-16 MED ORDER — FENTANYL CITRATE (PF) 100 MCG/2ML IJ SOLN
INTRAMUSCULAR | Status: DC | PRN
  Administered 2017-08-16 (×2): 50 ug via INTRAVENOUS
  Administered 2017-08-16: 100 ug via INTRAVENOUS
  Administered 2017-08-16: 50 ug via INTRAVENOUS

## 2017-08-16 MED ORDER — ROCURONIUM BROMIDE 10 MG/ML (PF) SYRINGE
PREFILLED_SYRINGE | INTRAVENOUS | Status: DC | PRN
  Administered 2017-08-16: 10 mg via INTRAVENOUS
  Administered 2017-08-16: 50 mg via INTRAVENOUS
  Administered 2017-08-16: 10 mg via INTRAVENOUS

## 2017-08-16 MED ORDER — PROPOFOL 10 MG/ML IV BOLUS
INTRAVENOUS | Status: DC | PRN
  Administered 2017-08-16: 160 mg via INTRAVENOUS

## 2017-08-16 MED ORDER — ACETAMINOPHEN 10 MG/ML IV SOLN
INTRAVENOUS | Status: AC
  Filled 2017-08-16: qty 100

## 2017-08-16 MED ORDER — CHLORHEXIDINE GLUCONATE CLOTH 2 % EX PADS: 6.0000 | MEDICATED_PAD | Freq: Once | CUTANEOUS | Status: DC

## 2017-08-16 MED ORDER — MIDAZOLAM HCL 5 MG/5ML IJ SOLN
INTRAMUSCULAR | Status: DC | PRN
  Administered 2017-08-16: 2 mg via INTRAVENOUS

## 2017-08-16 MED ORDER — LIDOCAINE 2% (20 MG/ML) 5 ML SYRINGE
INTRAMUSCULAR | Status: DC | PRN
  Administered 2017-08-16: 100 mg via INTRAVENOUS

## 2017-08-16 MED ORDER — HYDROMORPHONE HCL 1 MG/ML IJ SOLN: 1.0000 mg | INTRAMUSCULAR | Status: DC | PRN

## 2017-08-16 MED ORDER — ACETAMINOPHEN 650 MG RE SUPP: 650.0000 mg | Freq: Four times a day (QID) | RECTAL | Status: DC | PRN

## 2017-08-16 MED ORDER — CALCIUM CARBONATE 1250 (500 CA) MG PO TABS
2.0000 | ORAL_TABLET | Freq: Three times a day (TID) | ORAL | Status: DC
  Administered 2017-08-16 – 2017-08-17 (×2): 1000 mg via ORAL
  Filled 2017-08-16 (×2): qty 1

## 2017-08-16 MED ORDER — ONDANSETRON HCL 4 MG/2ML IJ SOLN
4.0000 mg | Freq: Four times a day (QID) | INTRAMUSCULAR | Status: DC | PRN
  Administered 2017-08-16: 4 mg via INTRAVENOUS
  Filled 2017-08-16: qty 2

## 2017-08-16 MED ORDER — TRAMADOL HCL 50 MG PO TABS: 50.0000 mg | ORAL_TABLET | Freq: Four times a day (QID) | ORAL | Status: DC | PRN

## 2017-08-16 MED ORDER — FENTANYL CITRATE (PF) 100 MCG/2ML IJ SOLN
25.0000 ug | INTRAMUSCULAR | Status: DC | PRN
  Administered 2017-08-16: 50 ug via INTRAVENOUS

## 2017-08-16 MED ORDER — ONDANSETRON HCL 4 MG/2ML IJ SOLN
INTRAMUSCULAR | Status: AC
  Filled 2017-08-16: qty 2

## 2017-08-16 MED ORDER — PROMETHAZINE HCL 25 MG/ML IJ SOLN
INTRAMUSCULAR | Status: AC
  Filled 2017-08-16: qty 1

## 2017-08-16 MED ORDER — DEXAMETHASONE SODIUM PHOSPHATE 10 MG/ML IJ SOLN
INTRAMUSCULAR | Status: AC
  Filled 2017-08-16: qty 1

## 2017-08-16 MED ORDER — LACTATED RINGERS IV SOLN
INTRAVENOUS | Status: DC
  Administered 2017-08-16 (×2): via INTRAVENOUS

## 2017-08-16 MED ORDER — PHENYLEPHRINE 40 MCG/ML (10ML) SYRINGE FOR IV PUSH (FOR BLOOD PRESSURE SUPPORT)
PREFILLED_SYRINGE | INTRAVENOUS | Status: DC | PRN
  Administered 2017-08-16 (×2): 80 ug via INTRAVENOUS

## 2017-08-16 MED ORDER — ONDANSETRON HCL 4 MG/2ML IJ SOLN
INTRAMUSCULAR | Status: DC | PRN
  Administered 2017-08-16: 4 mg via INTRAVENOUS

## 2017-08-16 MED ORDER — LIDOCAINE 2% (20 MG/ML) 5 ML SYRINGE
INTRAMUSCULAR | Status: AC
  Filled 2017-08-16: qty 5

## 2017-08-16 MED ORDER — DEXAMETHASONE SODIUM PHOSPHATE 10 MG/ML IJ SOLN
INTRAMUSCULAR | Status: DC | PRN
  Administered 2017-08-16: 10 mg via INTRAVENOUS

## 2017-08-16 MED ORDER — PROPOFOL 10 MG/ML IV BOLUS
INTRAVENOUS | Status: AC
  Filled 2017-08-16: qty 20

## 2017-08-16 MED ORDER — ONDANSETRON 4 MG PO TBDP: 4.0000 mg | ORAL_TABLET | Freq: Four times a day (QID) | ORAL | Status: DC | PRN

## 2017-08-16 MED ORDER — ROCURONIUM BROMIDE 10 MG/ML (PF) SYRINGE
PREFILLED_SYRINGE | INTRAVENOUS | Status: AC
  Filled 2017-08-16: qty 10

## 2017-08-16 MED ORDER — PROMETHAZINE HCL 25 MG/ML IJ SOLN
6.2500 mg | INTRAMUSCULAR | Status: DC | PRN
  Administered 2017-08-16: 6.25 mg via INTRAVENOUS

## 2017-08-16 MED ORDER — ACETAMINOPHEN 325 MG PO TABS
650.0000 mg | ORAL_TABLET | Freq: Four times a day (QID) | ORAL | Status: DC | PRN
  Administered 2017-08-16 – 2017-08-17 (×2): 650 mg via ORAL
  Filled 2017-08-16 (×2): qty 2

## 2017-08-16 MED ORDER — SUGAMMADEX SODIUM 200 MG/2ML IV SOLN
INTRAVENOUS | Status: DC | PRN
  Administered 2017-08-16: 125 mg via INTRAVENOUS

## 2017-08-16 MED ORDER — FLUTICASONE PROPIONATE 50 MCG/ACT NA SUSP
2.0000 | Freq: Every day | NASAL | Status: DC
  Administered 2017-08-16 – 2017-08-17 (×2): 2 via NASAL
  Filled 2017-08-16: qty 16

## 2017-08-16 MED ORDER — ACETAMINOPHEN 10 MG/ML IV SOLN
INTRAVENOUS | Status: DC | PRN
  Administered 2017-08-16: 1000 mg via INTRAVENOUS

## 2017-08-16 MED ORDER — CEFAZOLIN SODIUM-DEXTROSE 2-4 GM/100ML-% IV SOLN
INTRAVENOUS | Status: AC
  Filled 2017-08-16: qty 100

## 2017-08-16 MED ORDER — FENTANYL CITRATE (PF) 250 MCG/5ML IJ SOLN
INTRAMUSCULAR | Status: AC
  Filled 2017-08-16: qty 5

## 2017-08-16 MED ORDER — SCOPOLAMINE 1 MG/3DAYS TD PT72
MEDICATED_PATCH | TRANSDERMAL | Status: AC
  Filled 2017-08-16: qty 1

## 2017-08-16 MED ORDER — SCOPOLAMINE 1 MG/3DAYS TD PT72
MEDICATED_PATCH | TRANSDERMAL | Status: DC | PRN
  Administered 2017-08-16: 1 via TRANSDERMAL

## 2017-08-16 MED ORDER — FENTANYL CITRATE (PF) 100 MCG/2ML IJ SOLN
INTRAMUSCULAR | Status: AC
  Filled 2017-08-16: qty 2

## 2017-08-16 MED ORDER — CEFAZOLIN SODIUM-DEXTROSE 2-4 GM/100ML-% IV SOLN
2.0000 g | INTRAVENOUS | Status: AC
  Administered 2017-08-16: 2 g via INTRAVENOUS

## 2017-08-16 MED ORDER — KCL IN DEXTROSE-NACL 20-5-0.45 MEQ/L-%-% IV SOLN
INTRAVENOUS | Status: DC
  Administered 2017-08-16: 14:00:00 via INTRAVENOUS
  Filled 2017-08-16: qty 1000

## 2017-08-16 MED ORDER — MIDAZOLAM HCL 2 MG/2ML IJ SOLN
INTRAMUSCULAR | Status: AC
  Filled 2017-08-16: qty 2

## 2017-08-16 SURGICAL SUPPLY — 35 items
ATTRACTOMAT 16X20 MAGNETIC DRP (DRAPES) ×2 IMPLANT
BLADE SURG 15 STRL LF DISP TIS (BLADE) ×1 IMPLANT
BLADE SURG 15 STRL SS (BLADE) ×1
CHLORAPREP W/TINT 26ML (MISCELLANEOUS) ×4 IMPLANT
CLIP VESOCCLUDE MED 6/CT (CLIP) ×4 IMPLANT
CLIP VESOCCLUDE SM WIDE 6/CT (CLIP) ×8 IMPLANT
COVER SURGICAL LIGHT HANDLE (MISCELLANEOUS) ×2 IMPLANT
DISSECTOR ROUND CHERRY 3/8 STR (MISCELLANEOUS) IMPLANT
DRAPE LAPAROTOMY T 98X78 PEDS (DRAPES) ×2 IMPLANT
ELECT PENCIL ROCKER SW 15FT (MISCELLANEOUS) ×2 IMPLANT
ELECT REM PT RETURN 15FT ADLT (MISCELLANEOUS) ×2 IMPLANT
GAUZE 4X4 16PLY RFD (DISPOSABLE) ×2 IMPLANT
GAUZE SPONGE 4X4 12PLY STRL (GAUZE/BANDAGES/DRESSINGS) ×2 IMPLANT
GLOVE SURG ORTHO 8.0 STRL STRW (GLOVE) ×2 IMPLANT
GOWN STRL REUS W/TWL XL LVL3 (GOWN DISPOSABLE) ×4 IMPLANT
HEMOSTAT SURGICEL 2X4 FIBR (HEMOSTASIS) ×2 IMPLANT
ILLUMINATOR WAVEGUIDE N/F (MISCELLANEOUS) ×2 IMPLANT
KIT BASIN OR (CUSTOM PROCEDURE TRAY) ×2 IMPLANT
LIGHT WAVEGUIDE WIDE FLAT (MISCELLANEOUS) IMPLANT
PACK BASIC VI WITH GOWN DISP (CUSTOM PROCEDURE TRAY) ×2 IMPLANT
POWDER SURGICEL 3.0 GRAM (HEMOSTASIS) IMPLANT
SHEARS HARMONIC 9CM CVD (BLADE) ×2 IMPLANT
STAPLER VISISTAT 35W (STAPLE) IMPLANT
STRIP CLOSURE SKIN 1/2X4 (GAUZE/BANDAGES/DRESSINGS) ×2 IMPLANT
SUT MNCRL AB 4-0 PS2 18 (SUTURE) ×2 IMPLANT
SUT SILK 2 0 (SUTURE)
SUT SILK 2-0 18XBRD TIE 12 (SUTURE) IMPLANT
SUT SILK 3 0 (SUTURE)
SUT SILK 3-0 18XBRD TIE 12 (SUTURE) IMPLANT
SUT VIC AB 3-0 SH 18 (SUTURE) ×4 IMPLANT
SYR BULB IRRIGATION 50ML (SYRINGE) ×2 IMPLANT
TAPE CLOTH SURG 4X10 WHT LF (GAUZE/BANDAGES/DRESSINGS) ×2 IMPLANT
TOWEL OR 17X26 10 PK STRL BLUE (TOWEL DISPOSABLE) ×2 IMPLANT
TOWEL OR NON WOVEN STRL DISP B (DISPOSABLE) ×2 IMPLANT
YANKAUER SUCT BULB TIP 10FT TU (MISCELLANEOUS) ×2 IMPLANT

## 2017-08-16 NOTE — Interval H&P Note (Signed)
History and Physical Interval Note:  08/16/2017 8:57 AM  Brooke Green  has presented today for surgery, with the diagnosis of papillary thyroid carcinoma.  The various methods of treatment have been discussed with the patient and family. After consideration of risks, benefits and other options for treatment, the patient has consented to    Procedure(s): TOTAL THYROIDECTOMY WITH LIMITED LYMPH NODE DISSECTION (N/A) as a surgical intervention .    The patient's history has been reviewed, patient examined, no change in status, stable for surgery.  I have reviewed the patient's chart and labs.  Questions were answered to the patient's satisfaction.    Armandina Gemma, [facility] Office: [place]

## 2017-08-16 NOTE — Anesthesia Procedure Notes (Signed)
Procedure Name: Intubation Date/Time: 08/16/2017 9:25 AM Performed by: Samuella Rasool D, CRNA Pre-anesthesia Checklist: Patient identified, Emergency Drugs available, Suction available and Patient being monitored Patient Re-evaluated:Patient Re-evaluated prior to induction Oxygen Delivery Method: Circle system utilized Preoxygenation: Pre-oxygenation with 100% oxygen Induction Type: IV induction Ventilation: Mask ventilation without difficulty Laryngoscope Size: Mac and 4 Grade View: Grade II Tube type: Oral Tube size: 7.0 mm Number of attempts: 1 Airway Equipment and Method: Stylet Placement Confirmation: ETT inserted through vocal cords under direct vision,  positive ETCO2 and breath sounds checked- equal and bilateral Secured at: 21 cm Tube secured with: Tape Dental Injury: Teeth and Oropharynx as per pre-operative assessment

## 2017-08-16 NOTE — Transfer of Care (Signed)
Immediate Anesthesia Transfer of Care Note  Patient: Brooke Green  Procedure(s) Performed: TOTAL THYROIDECTOMY WITH LIMITED LYMPH NODE DISSECTION (N/A Neck)  Patient Location: PACU  Anesthesia Type:General  Level of Consciousness: awake, alert  and oriented  Airway & Oxygen Therapy: Patient Spontanous Breathing and Patient connected to face mask oxygen  Post-op Assessment: Report given to RN and Post -op Vital signs reviewed and stable  Post vital signs: Reviewed and stable  Last Vitals:  Vitals Value Taken Time  BP    Temp    Pulse    Resp    SpO2      Last Pain:  Vitals:   08/16/17 0802  TempSrc: Oral         Complications: No apparent anesthesia complications

## 2017-08-16 NOTE — Op Note (Signed)
Procedure Note  Pre-operative Diagnosis:  Papillary thyroid carcinoma  Post-operative Diagnosis:  same  Surgeon:  Armandina Gemma, MD  Assistant:  none   Procedure:  Total thyroidectomy with limited central compartment lymph node dissection  Anesthesia:  General  Estimated Blood Loss:  minimal  Drains: none         Specimen: thyroid to pathology; central compartment lymph nodes to pathology  Indications:  Patient is referred by Dr. Jacelyn Pi for surgical evaluation and management of papillary thyroid carcinoma. Patient's primary care physician is Dr. Carol Ada. Patient had presented to her primary physician with complaints of mild dysphagia. This had occurred over the past 1-2 years. Patient was sent for an ultrasound on May 18, 2017. This demonstrated a normal size thyroid gland with 2 dominant nodules in the right thyroid lobe measuring 1.2 cm and 1.1 cm. Biopsy was recommended on each nodule. Biopsies were subsequently performed on June 14, 2017. Each of these showed evidence of papillary thyroid carcinoma.   Procedure Details: Procedure was done in OR #1 at the [facility].  The patient was brought to the operating room and placed in a supine position on the operating room table.  Following administration of general anesthesia, the patient was positioned and then prepped and draped in the usual aseptic fashion.  After ascertaining that an adequate level of anesthesia had been achieved, a Kocher incision was made with #15 blade.  Dissection was carried through subcutaneous tissues and platysma. Hemostasis was achieved with the electrocautery.  Skin flaps were elevated cephalad and caudad from the thyroid notch to the sternal notch.  The [place] self-retaining retractor was placed for exposure.  Strap muscles were incised in the midline and dissection was begun on the left side.  Strap muscles were reflected laterally.  Left thyroid lobe was small and without nodules.   The left lobe was gently mobilized with blunt dissection.  Superior pole vessels were dissected out and divided individually between small and medium Ligaclips with the Harmonic scalpel.  The thyroid lobe was rolled anteriorly.  Branches of the inferior thyroid artery were divided between small Ligaclips with the Harmonic scalpel.  Inferior venous tributaries were divided between Ligaclips.  Both the superior and inferior parathyroid glands were identified and preserved on their vascular pedicles.  The recurrent laryngeal nerve was identified and preserved along its course.  The ligament of Gwenlyn Found was released with the electrocautery and the gland was mobilized onto the anterior trachea. Isthmus was mobilized across the midline.  There was a very small pyramidal lobe present which was resected with the isthmus.  Dry pack was placed in the left neck.  Next, the right thyroid lobe was gently mobilized with blunt dissection.  Right thyroid lobe was slightly larger than the left and contained a firm nodule in the superior pole.  Superior pole vessels were dissected out and divided between small and medium Ligaclips with the Harmonic scalpel.  Superior parathyroid was identified and preserved.  Inferior venous tributaries were divided between medium Ligaclips with the Harmonic scalpel.  The right thyroid lobe was rolled anteriorly and the branches of the inferior thyroid artery divided between small Ligaclips.  The right recurrent laryngeal nerve was identified and preserved along its course.  The nerve tracked immediately adjacent to the tumor in the superior pole, but did not appear to be infiltrated by the tumor.  The ligament of Gwenlyn Found was released with the electrocautery.  The right thyroid lobe was mobilized onto the anterior trachea  and the remainder of the thyroid was dissected off the anterior trachea and the thyroid was completely excised.  A suture was used to mark the right lobe. The entire thyroid gland was  submitted to pathology for review.  There was a palpable firm mass posterior to the mid portion of the right thyroid lobe.  This was immediately adjacent to the recurrent nerve.  Again, the nerve was dissected off of the nodule and did not appear to be infiltrated by the nodule/tumor.  The nodule was excised and submitted to pathology with the central compartment lymph nodes.  The central compartment was dissected out taking care to avoid the bilateral inferior parathyroid glands.  There were palpable small nodes in the tissue anterior to the trachea.  This was excised with the electrocautery and small vessels with divided between ligaclips with the Harmonic scalpel.  The tissue was submitted to pathology.  The palpable mass in the high right anterior cervical region was palpated through the skin but could not be reached from the [place] incision.  Decision was made to leave this in place and evaluate post-operatively, rather than make a second counter incision to dissect it out.  If it represents metastatic papillary cancer, the patient will likely require modified neck dissection.  The neck was irrigated with warm saline.  Fibrillar was placed throughout the operative field.  Strap muscles were reapproximated in the midline with interrupted 3-0 Vicryl sutures.  Platysma was closed with interrupted 3-0 Vicryl sutures.  Skin was closed with a running 4-0 Monocryl subcuticular suture.  Wound was washed and dried and steri-strips were applied.  Dry gauze dressing was placed.  The patient was awakened from anesthesia and brought to the recovery room.  The patient tolerated the procedure well.   Armandina Gemma, MD [facility] Office: [phone]

## 2017-08-16 NOTE — Anesthesia Postprocedure Evaluation (Signed)
Anesthesia Post Note  Patient: Brooke Green  Procedure(s) Performed: TOTAL THYROIDECTOMY WITH LIMITED LYMPH NODE DISSECTION (N/A Neck)     Patient location during evaluation: PACU Anesthesia Type: General Level of consciousness: awake and alert Pain management: pain level controlled Vital Signs Assessment: post-procedure vital signs reviewed and stable Respiratory status: spontaneous breathing, nonlabored ventilation, respiratory function stable and patient connected to nasal cannula oxygen Cardiovascular status: blood pressure returned to baseline and stable Postop Assessment: no apparent nausea or vomiting Anesthetic complications: no    Last Vitals:  Vitals:   08/16/17 1333 08/16/17 1435  BP: 135/83 116/77  Pulse: 77 71  Resp: 14 14  Temp: (!) 36.3 C 36.4 C  SpO2: 100% 99%    Last Pain:  Vitals:   08/16/17 1230  TempSrc:   PainSc: Asleep                 Rabiah Goeser L Tanav Orsak

## 2017-08-17 ENCOUNTER — Encounter (HOSPITAL_COMMUNITY): Payer: Self-pay | Admitting: Surgery

## 2017-08-17 DIAGNOSIS — C73 Malignant neoplasm of thyroid gland: Secondary | ICD-10-CM | POA: Diagnosis not present

## 2017-08-17 LAB — BASIC METABOLIC PANEL
Anion gap: 9 (ref 5–15)
BUN: 13 mg/dL (ref 6–20)
CO2: 27 mmol/L (ref 22–32)
Calcium: 8 mg/dL — ABNORMAL LOW (ref 8.9–10.3)
Chloride: 103 mmol/L (ref 98–111)
Creatinine, Ser: 0.81 mg/dL (ref 0.44–1.00)
GFR calc Af Amer: >60 mL/min (ref >60)
GFR calc non Af Amer: >60 mL/min (ref >60)
Glucose, Bld: 121 mg/dL — ABNORMAL HIGH (ref 70–99)
Potassium: 3.7 mmol/L (ref 3.5–5.1)
Sodium: 139 mmol/L (ref 135–145)

## 2017-08-17 MED ORDER — SODIUM CHLORIDE 0.9 % IV SOLN
2.0000 g | INTRAVENOUS | Status: AC
  Administered 2017-08-17: 2 g via INTRAVENOUS
  Filled 2017-08-17: qty 20

## 2017-08-17 MED ORDER — LEVOTHYROXINE SODIUM 88 MCG PO TABS: 88.0000 ug | ORAL_TABLET | Freq: Every day | ORAL | 3 refills | Status: DC

## 2017-08-17 MED ORDER — TRAMADOL HCL 50 MG PO TABS: 50.0000 mg | ORAL_TABLET | Freq: Four times a day (QID) | ORAL | 0 refills | Status: DC | PRN

## 2017-08-17 MED ORDER — CALCIUM CARBONATE ANTACID 500 MG PO CHEW: 2.0000 | CHEWABLE_TABLET | Freq: Three times a day (TID) | ORAL | 1 refills | Status: DC

## 2017-08-17 NOTE — Progress Notes (Signed)
Discharge and medication instructions given to patient and spouse. Questions answered and both deny further questions. One prescription given to patient. Spouse is driving patient home. Donne Hazel, RN

## 2017-08-17 NOTE — Care Management Note (Signed)
Case Management Note  Patient Details  Name: Brooke Green MRN: [id number] Date of Birth: December 27, 1960  Subjective/Objective:       Discharge              Action/Plan:  No cm needs at time of discharge.   Expected Discharge Date:  08/17/17               Expected Discharge Plan:  Home/Self Care  In-House Referral:     Discharge planning Services  CM Consult  Post Acute Care Choice:    Choice offered to:     DME Arranged:    DME Agency:     HH Arranged:    HH Agency:     Status of Service:  Completed, signed off  If discussed at [employer] of Stay Meetings, dates discussed:    Additional Comments:  Leeroy Cha, RN 08/17/2017, 8:29 AM

## 2017-08-17 NOTE — Discharge Summary (Signed)
    Physician Discharge Summary [facility]  Patient ID: Brooke Green MRN: [id number] DOB/AGE: February 26, 1960 57 y.o.  Admit date: 08/16/2017 Discharge date: 08/17/2017  Admission Diagnoses:  Papillary thyroid carcinoma  Discharge Diagnoses:  Principal Problem:   Papillary thyroid carcinoma [facility])   Discharged Condition: good  Hospital Course: Patient was admitted for observation following thyroid surgery.  Post op course was uncomplicated.  Pain was well controlled.  Tolerated diet.  Post op calcium level on morning following surgery was 8.0 mg/dl.  Calcium gluconate 2 gm IV was administered prior to discharge home.  Patient was prepared for discharge home on POD#1.  Consults: None  Treatments: surgery: total thyroidectomy with limited lymph node dissection  Discharge Exam: Blood pressure 115/68, pulse 78, temperature 98.9 F (37.2 C), temperature source Oral, resp. rate 14, height 5' 5.5" (1.664 m), weight 63 kg, last menstrual period 07/03/2008, SpO2 95 %. HEENT - clear Neck - wound dry and intact; mild STS; voice normal Chest - clear bilaterally Cor - RRR  Disposition: Home  Discharge Instructions    Diet - low sodium heart healthy   Complete by:  As directed    Increase activity slowly   Complete by:  As directed    Remove dressing in 24 hours   Complete by:  As directed      Allergies as of 08/17/2017      Reactions   Doxycycline Nausea Only      Medication List    TAKE these medications   calcium carbonate 500 MG chewable tablet Commonly known as:  TUMS - dosed in mg elemental calcium Chew 2 tablets (400 mg of elemental calcium total) by mouth 3 (three) times daily.   Calcium-Magnesium-Vitamin D [id number] MG-MG-UNIT Tabs Take 2 tablets by mouth daily.   fluticasone 50 MCG/ACT nasal spray Commonly known as:  FLONASE Place 2 sprays into both nostrils daily.   levothyroxine 88 MCG tablet Commonly known as:  SYNTHROID, LEVOTHROID Take 1  tablet (88 mcg total) by mouth daily before breakfast. What changed:    medication strength  how much to take   loratadine 10 MG tablet Commonly known as:  CLARITIN Take 10 mg by mouth daily as needed for allergies.   meclizine 12.5 MG tablet Commonly known as:  ANTIVERT Take 1 tablet (12.5 mg total) by mouth 3 (three) times daily as needed for dizziness.   Melatonin 3 MG Caps Take 3 mg by mouth at bedtime as needed (sleep).   traMADol 50 MG tablet Commonly known as:  ULTRAM Take 1-2 tablets (50-100 mg total) by mouth every 6 (six) hours as needed for moderate pain.   WOMENS MULTIVITAMIN PLUS PO Take 1 tablet by mouth daily.      Follow-up Information    Armandina Gemma, MD. Schedule an appointment as soon as possible for a visit in 3 week(s).   Specialty:  General Surgery Contact information: [address] [place] [address] [phone]        Jacelyn Pi, MD. Schedule an appointment as soon as possible for a visit in 3 week(s).   Specialty:  Endocrinology Contact information: [address] [place] [address] [phone]           Earnstine Regal, MD, [facility] Office: [phone]   Signed: Earnstine Regal 08/17/2017, 8:13 AM

## 2017-08-17 NOTE — Discharge Instructions (Signed)
[facility]  THYROID & PARATHYROID SURGERY:  POST-OP INSTRUCTIONS  Always review your discharge instruction sheet from the facility where your surgery was performed.  A prescription for pain medication may be given to you upon discharge.  Take your pain medication as prescribed.  If narcotic pain medicine is not needed, then you may take acetaminophen (Tylenol) or ibuprofen (Advil) as needed.  Take your usually prescribed medications unless otherwise directed.  If you need a refill on your pain medication, please contact our office during regular business hours.  Prescriptions cannot be processed by our office after 5 pm or on weekends.  Start with a light diet upon arrival home, such as soup and crackers or toast.  Be sure to drink plenty of fluids daily.  Resume your normal diet the day after surgery.  Most patients will experience some swelling and bruising on the chest and neck area.  Ice packs will help.  Swelling and bruising can take several days to resolve.   It is common to experience some constipation after surgery.  Increasing fluid intake and taking a stool softener (Colace) will usually help or prevent this problem.  A mild laxative (Milk of Magnesia or Miralax) should be taken according to package directions if there has been no bowel movement after 48 hours.  You have steri-strips and a gauze dressing over your incision.  You may remove the gauze bandage on the second day after surgery, and you may shower at that time.  Leave your steri-strips (small skin tapes) in place directly over the incision.  These strips should remain on the skin for 5-7 days and then be removed.  You may get them wet in the shower and pat them dry.  You may resume regular (light) daily activities beginning the next day (such as daily self-care, walking, climbing stairs) gradually increasing activities as tolerated.  You may have sexual intercourse when it is comfortable.  Refrain from  any heavy lifting or straining until approved by your doctor.  You may drive when you no longer are taking prescription pain medication, you can comfortably wear a seatbelt, and you can safely maneuver your car and apply brakes.  You should see your doctor in the office for a follow-up appointment approximately three weeks after your surgery.  Make sure that you call for this appointment within a day or two after you arrive home to insure a convenient appointment time.  WHEN TO CALL YOUR DOCTOR: -- Fever greater than 101.5 -- Inability to urinate -- Nausea and/or vomiting - persistent -- Extreme swelling or bruising -- Continued bleeding from incision -- Increased pain, redness, or drainage from the incision -- Difficulty swallowing or breathing -- Muscle cramping or spasms -- Numbness or tingling in hands or around lips  The clinic staff is available to answer your questions during regular business hours.  Please don't hesitate to call and ask to speak to one of the nurses if you have concerns.  Brooke Schoenfelder, MD [facility] Office: [phone] [facility]  THYROID & PARATHYROID SURGERY:  POST-OP INSTRUCTIONS  Always review your discharge instruction sheet from the facility where your surgery was performed.  A prescription for pain medication may be given to you upon discharge.  Take your pain medication as prescribed.  If narcotic pain medicine is not needed, then you may take acetaminophen (Tylenol) or ibuprofen (Advil) as needed.  Take your usually prescribed medications unless otherwise directed.  If you need a refill   on your pain medication, please contact our office during regular business hours.  Prescriptions cannot be processed by our office after 5 pm or on weekends.  Start with a light diet upon arrival home, such as soup and crackers or toast.  Be sure to drink plenty of fluids daily.  Resume your normal diet the day after  surgery.  Most patients will experience some swelling and bruising on the chest and neck area.  Ice packs will help.  Swelling and bruising can take several days to resolve.   It is common to experience some constipation after surgery.  Increasing fluid intake and taking a stool softener (Colace) will usually help or prevent this problem.  A mild laxative (Milk of Magnesia or Miralax) should be taken according to package directions if there has been no bowel movement after 48 hours.  You have steri-strips and a gauze dressing over your incision.  You may remove the gauze bandage on the second day after surgery, and you may shower at that time.  Leave your steri-strips (small skin tapes) in place directly over the incision.  These strips should remain on the skin for 5-7 days and then be removed.  You may get them wet in the shower and pat them dry.  You may resume regular (light) daily activities beginning the next day (such as daily self-care, walking, climbing stairs) gradually increasing activities as tolerated.  You may have sexual intercourse when it is comfortable.  Refrain from any heavy lifting or straining until approved by your doctor.  You may drive when you no longer are taking prescription pain medication, you can comfortably wear a seatbelt, and you can safely maneuver your car and apply brakes.  You should see your doctor in the office for a follow-up appointment approximately three weeks after your surgery.  Make sure that you call for this appointment within a day or two after you arrive home to insure a convenient appointment time.  WHEN TO CALL YOUR DOCTOR: -- Fever greater than 101.5 -- Inability to urinate -- Nausea and/or vomiting - persistent -- Extreme swelling or bruising -- Continued bleeding from incision -- Increased pain, redness, or drainage from the incision -- Difficulty swallowing or breathing -- Muscle cramping or spasms -- Numbness or tingling in hands or  around lips  The clinic staff is available to answer your questions during regular business hours.  Please don't hesitate to call and ask to speak to one of the nurses if you have concerns.  Brooke Borror, MD [facility] Office: [phone]  

## 2017-08-20 DIAGNOSIS — C73 Malignant neoplasm of thyroid gland: Secondary | ICD-10-CM | POA: Diagnosis not present

## 2017-08-28 DIAGNOSIS — Z7289 Other problems related to lifestyle: Secondary | ICD-10-CM | POA: Diagnosis not present

## 2017-08-28 DIAGNOSIS — Z85850 Personal history of malignant neoplasm of thyroid: Secondary | ICD-10-CM | POA: Diagnosis not present

## 2017-08-28 DIAGNOSIS — R221 Localized swelling, mass and lump, neck: Secondary | ICD-10-CM | POA: Diagnosis not present

## 2017-09-11 ENCOUNTER — Other Ambulatory Visit: Payer: Self-pay | Admitting: Otolaryngology

## 2017-09-11 DIAGNOSIS — R221 Localized swelling, mass and lump, neck: Secondary | ICD-10-CM

## 2017-09-11 DIAGNOSIS — E890 Postprocedural hypothyroidism: Secondary | ICD-10-CM | POA: Diagnosis not present

## 2017-09-11 DIAGNOSIS — C73 Malignant neoplasm of thyroid gland: Secondary | ICD-10-CM | POA: Diagnosis not present

## 2017-09-17 ENCOUNTER — Encounter: Payer: Self-pay | Admitting: Radiology

## 2017-09-17 ENCOUNTER — Ambulatory Visit
Admission: RE | Admit: 2017-09-17 | Discharge: 2017-09-17 | Disposition: A | Discharge status: Home / Self Care | Payer: 59 | Source: Ambulatory Visit | Attending: Otolaryngology | Admitting: Otolaryngology

## 2017-09-17 DIAGNOSIS — R221 Localized swelling, mass and lump, neck: Secondary | ICD-10-CM

## 2017-09-17 DIAGNOSIS — C73 Malignant neoplasm of thyroid gland: Secondary | ICD-10-CM | POA: Diagnosis not present

## 2017-09-17 MED ORDER — IOPAMIDOL (ISOVUE-300) INJECTION 61%
75.0000 mL | Freq: Once | INTRAVENOUS | Status: AC | PRN
  Administered 2017-09-17: 75 mL via INTRAVENOUS

## 2017-09-19 ENCOUNTER — Other Ambulatory Visit: Payer: Self-pay | Admitting: Otolaryngology

## 2017-09-21 DIAGNOSIS — H40013 Open angle with borderline findings, low risk, bilateral: Secondary | ICD-10-CM | POA: Diagnosis not present

## 2017-10-04 ENCOUNTER — Other Ambulatory Visit: Payer: Self-pay

## 2017-10-04 ENCOUNTER — Encounter (HOSPITAL_COMMUNITY): Payer: Self-pay

## 2017-10-04 ENCOUNTER — Encounter (HOSPITAL_COMMUNITY)
Admission: RE | Admit: 2017-10-04 | Discharge: 2017-10-04 | Disposition: A | Discharge status: Home / Self Care | Payer: 59 | Source: Ambulatory Visit | Attending: Otolaryngology | Admitting: Otolaryngology

## 2017-10-04 DIAGNOSIS — Z01812 Encounter for preprocedural laboratory examination: Secondary | ICD-10-CM | POA: Diagnosis not present

## 2017-10-04 HISTORY — DX: Dizziness and giddiness: R42

## 2017-10-04 HISTORY — DX: Anemia, unspecified: D64.9

## 2017-10-04 HISTORY — DX: Nausea with vomiting, unspecified: R11.2

## 2017-10-04 HISTORY — DX: Other specified postprocedural states: Z98.890

## 2017-10-04 LAB — BASIC METABOLIC PANEL
Anion gap: 11 (ref 5–15)
BUN: 12 mg/dL (ref 6–20)
CO2: 24 mmol/L (ref 22–32)
Calcium: 9.1 mg/dL (ref 8.9–10.3)
Chloride: 104 mmol/L (ref 98–111)
Creatinine, Ser: 0.89 mg/dL (ref 0.44–1.00)
GFR calc Af Amer: >60 mL/min (ref >60)
GFR calc non Af Amer: >60 mL/min (ref >60)
Glucose, Bld: 94 mg/dL (ref 70–99)
Potassium: 4 mmol/L (ref 3.5–5.1)
Sodium: 139 mmol/L (ref 135–145)

## 2017-10-04 LAB — CBC
HCT: 42.2 % (ref 36.0–46.0)
Hemoglobin: 13.6 g/dL (ref 12.0–15.0)
MCH: 29.8 pg (ref 26.0–34.0)
MCHC: 32.2 g/dL (ref 30.0–36.0)
MCV: 92.5 fL (ref 78.0–100.0)
Platelets: 191 10*3/uL (ref 150–400)
RBC: 4.56 MIL/uL (ref 3.87–5.11)
RDW: 12.9 % (ref 11.5–15.5)
WBC: 5.7 10*3/uL (ref 4.0–10.5)

## 2017-10-04 NOTE — Pre-Procedure Instructions (Signed)
Brooke Green  10/04/2017      [address], [name] [address] Phone: [phone] Fax: [phone]    Your procedure is scheduled on 10-09-2017  Tuesday   Report to [facility] Admitting at 7:15 A.M.   Call this number if you have problems the morning of surgery:  [phone]   Remember:  Do not eat or drink after midnight.                            Take these medicines the morning of surgery with A SIP OF WATER  Flonase nasal spray if needed Levothyroxine(Synthroid) Loratadine(Claritin) if needed Meclizine(Antivert) if needed   STOP TAKING ANY ASPIRIN (UNLESS OTHERWISE INSTRUCTED BY YOUR SURGEON),ANTIINFLAMATORIES (IBUPROFEN,ALEVE,MOTRIN,ADVIL,GOODY'S POWDERS),HERBAL SUPPLEMENTS,FISH OIL,AND VITAMINS 5-7 DAYS PRIOR TO SURGERY     Do not wear jewelry, make-up or nail polish.  Do not wear lotions, powders, or perfumes, or deodorant.  Do not shave 48 hours prior to surgery.  .  Do not bring valuables to the hospital.   [facility] is not responsible for any belongings or valuables.  Contacts, dentures or bridgework may not be worn into surgery.  Leave your suitcase in the car.  After surgery it may be brought to your room.  For patients admitted to the hospital, discharge time will be determined by your treatment team.  Patients discharged the day of surgery will not be allowed to drive home.      [facility] - Preparing for Surgery  Before surgery, you can play an important role.  Because skin is not sterile, your skin needs to be as free of germs as possible.  You can reduce the number of germs on you skin by washing with CHG (chlorahexidine gluconate) soap before surgery.  CHG is an antiseptic cleaner which kills germs and bonds with the skin to continue killing germs even after washing.  Oral Hygiene is also important in reducing the risk of infection.  Remember to  brush your teeth with your regular toothpaste the morning of surgery.  Please DO NOT use if you have an allergy to CHG or antibacterial soaps.  If your skin becomes reddened/irritated stop using the CHG and inform your nurse when you arrive at Short Stay.  Do not shave (including legs and underarms) for at least 48 hours prior to the first CHG shower.  You may shave your face.  Please follow these instructions carefully:   1.  Shower with CHG Soap the night before surgery and the morning of Surgery.  2.  If you choose to wash your hair, wash your hair first as usual with your normal shampoo.  3.  After you shampoo, rinse your hair and body thoroughly to remove the shampoo. 4.  Use CHG as you would any other liquid soap.  You can apply chg directly to the skin and wash gently with a      scrungie or washcloth.           5.  Apply the CHG Soap to your body ONLY FROM THE NECK DOWN.   Do not use on open wounds or open sores. Avoid contact with your eyes, ears, mouth and genitals (private parts).  Wash genitals (private parts) with your normal soap.  6.  Wash thoroughly, paying special attention to the area where your surgery will be performed.  7.  Thoroughly rinse your body with warm water from the neck down.  8.  DO NOT shower/wash with your normal soap after using and rinsing off the CHG Soap.  9.  Pat yourself dry with a clean towel.            10.  Wear clean pajamas.            11.  Place clean sheets on your bed the night of your first shower and do not sleep with pets.  Day of Surgery  Do not apply any lotions/deoderants the morning of surgery.   Please wear clean clothes to the hospital/surgery center. Remember to brush your teeth with toothpaste.   Please read over the following fact sheets that you were given. Pain Booklet and Surgical Site Infection Prevention

## 2017-10-04 NOTE — Progress Notes (Addendum)
PCP  Carol Ada MD   Pt. States she saw Dr. Lequita Halt in 2015 for dizziness,was never given a diagnosis and did not return for any follow-up visit.  Denies any cardiac testing.Pt. States the last time she has any dizziness was about 2 years ago.

## 2017-10-09 ENCOUNTER — Other Ambulatory Visit: Payer: Self-pay

## 2017-10-09 ENCOUNTER — Encounter (HOSPITAL_COMMUNITY): Payer: Self-pay | Admitting: Surgery

## 2017-10-09 ENCOUNTER — Encounter (HOSPITAL_COMMUNITY)
Admission: RE | Disposition: A | Discharge status: Home / Self Care | Payer: Self-pay | Source: Ambulatory Visit | Attending: Otolaryngology

## 2017-10-09 ENCOUNTER — Observation Stay (HOSPITAL_COMMUNITY)
Admission: RE | Admit: 2017-10-09 | Discharge: 2017-10-10 | Disposition: A | Discharge status: Home / Self Care | Payer: 59 | Source: Ambulatory Visit | Attending: Otolaryngology | Admitting: Otolaryngology

## 2017-10-09 ENCOUNTER — Ambulatory Visit (HOSPITAL_COMMUNITY): Payer: 59 | Admitting: Registered Nurse

## 2017-10-09 DIAGNOSIS — C770 Secondary and unspecified malignant neoplasm of lymph nodes of head, face and neck: Principal | ICD-10-CM | POA: Insufficient documentation

## 2017-10-09 DIAGNOSIS — E890 Postprocedural hypothyroidism: Secondary | ICD-10-CM | POA: Insufficient documentation

## 2017-10-09 DIAGNOSIS — R221 Localized swelling, mass and lump, neck: Secondary | ICD-10-CM | POA: Diagnosis present

## 2017-10-09 DIAGNOSIS — Z85828 Personal history of other malignant neoplasm of skin: Secondary | ICD-10-CM | POA: Diagnosis not present

## 2017-10-09 DIAGNOSIS — C73 Malignant neoplasm of thyroid gland: Secondary | ICD-10-CM | POA: Diagnosis not present

## 2017-10-09 DIAGNOSIS — Z79890 Hormone replacement therapy (postmenopausal): Secondary | ICD-10-CM | POA: Insufficient documentation

## 2017-10-09 DIAGNOSIS — Z85850 Personal history of malignant neoplasm of thyroid: Secondary | ICD-10-CM | POA: Diagnosis not present

## 2017-10-09 HISTORY — PX: RADICAL NECK DISSECTION: SHX2284

## 2017-10-09 SURGERY — DISSECTION, NECK, RADICAL
Anesthesia: General | Site: Neck | Laterality: Right

## 2017-10-09 MED ORDER — ONDANSETRON 4 MG PO TBDP: 4.0000 mg | ORAL_TABLET | Freq: Four times a day (QID) | ORAL | Status: DC | PRN

## 2017-10-09 MED ORDER — PROPOFOL 10 MG/ML IV BOLUS
INTRAVENOUS | Status: DC | PRN
  Administered 2017-10-09: 150 mg via INTRAVENOUS

## 2017-10-09 MED ORDER — HYDROCODONE-ACETAMINOPHEN 5-325 MG PO TABS
1.0000 | ORAL_TABLET | ORAL | Status: DC | PRN
  Administered 2017-10-09: 1 via ORAL
  Administered 2017-10-09 (×2): 2 via ORAL
  Administered 2017-10-10 (×2): 1 via ORAL
  Filled 2017-10-09 (×2): qty 1
  Filled 2017-10-09: qty 2
  Filled 2017-10-09: qty 1

## 2017-10-09 MED ORDER — LACTATED RINGERS IV SOLN
INTRAVENOUS | Status: DC | PRN
  Administered 2017-10-09: 09:00:00 via INTRAVENOUS

## 2017-10-09 MED ORDER — LEVOTHYROXINE SODIUM 100 MCG PO TABS
100.0000 ug | ORAL_TABLET | Freq: Every day | ORAL | Status: DC
  Administered 2017-10-10: 100 ug via ORAL
  Filled 2017-10-09: qty 1

## 2017-10-09 MED ORDER — ROCURONIUM BROMIDE 10 MG/ML (PF) SYRINGE
PREFILLED_SYRINGE | INTRAVENOUS | Status: DC | PRN
  Administered 2017-10-09: 10 mg via INTRAVENOUS
  Administered 2017-10-09: 50 mg via INTRAVENOUS
  Administered 2017-10-09: 10 mg via INTRAVENOUS

## 2017-10-09 MED ORDER — SODIUM CHLORIDE 0.9 % IV SOLN
INTRAVENOUS | Status: DC | PRN
  Administered 2017-10-09: 25 ug/min via INTRAVENOUS

## 2017-10-09 MED ORDER — ONDANSETRON HCL 4 MG/2ML IJ SOLN
INTRAMUSCULAR | Status: DC | PRN
  Administered 2017-10-09: 4 mg via INTRAVENOUS

## 2017-10-09 MED ORDER — CHLORHEXIDINE GLUCONATE CLOTH 2 % EX PADS: 6.0000 | MEDICATED_PAD | Freq: Once | CUTANEOUS | Status: DC

## 2017-10-09 MED ORDER — ROCURONIUM BROMIDE 50 MG/5ML IV SOSY
PREFILLED_SYRINGE | INTRAVENOUS | Status: AC
  Filled 2017-10-09: qty 5

## 2017-10-09 MED ORDER — HYDROCODONE-ACETAMINOPHEN 5-325 MG PO TABS
ORAL_TABLET | ORAL | Status: AC
  Filled 2017-10-09: qty 2

## 2017-10-09 MED ORDER — SODIUM CHLORIDE 0.9 % IJ SOLN
INTRAMUSCULAR | Status: AC
  Filled 2017-10-09: qty 20

## 2017-10-09 MED ORDER — OXYCODONE HCL 5 MG PO TABS: 5.0000 mg | ORAL_TABLET | Freq: Once | ORAL | Status: DC | PRN

## 2017-10-09 MED ORDER — ONDANSETRON HCL 4 MG/2ML IJ SOLN: 4.0000 mg | Freq: Four times a day (QID) | INTRAMUSCULAR | Status: DC | PRN

## 2017-10-09 MED ORDER — EPHEDRINE 5 MG/ML INJ
INTRAVENOUS | Status: AC
  Filled 2017-10-09: qty 10

## 2017-10-09 MED ORDER — ONDANSETRON HCL 4 MG/2ML IJ SOLN
INTRAMUSCULAR | Status: AC
  Filled 2017-10-09: qty 2

## 2017-10-09 MED ORDER — LIDOCAINE 2% (20 MG/ML) 5 ML SYRINGE
INTRAMUSCULAR | Status: DC | PRN
  Administered 2017-10-09: 40 mg via INTRAVENOUS
  Administered 2017-10-09: 60 mg via INTRAVENOUS

## 2017-10-09 MED ORDER — SCOPOLAMINE 1 MG/3DAYS TD PT72
MEDICATED_PATCH | TRANSDERMAL | Status: DC | PRN
  Administered 2017-10-09: 1 via TRANSDERMAL

## 2017-10-09 MED ORDER — PHENYLEPHRINE 40 MCG/ML (10ML) SYRINGE FOR IV PUSH (FOR BLOOD PRESSURE SUPPORT)
PREFILLED_SYRINGE | INTRAVENOUS | Status: AC
  Filled 2017-10-09: qty 10

## 2017-10-09 MED ORDER — PHENYLEPHRINE 40 MCG/ML (10ML) SYRINGE FOR IV PUSH (FOR BLOOD PRESSURE SUPPORT)
PREFILLED_SYRINGE | INTRAVENOUS | Status: DC | PRN
  Administered 2017-10-09: 80 ug via INTRAVENOUS

## 2017-10-09 MED ORDER — BACITRACIN ZINC 500 UNIT/GM EX OINT
TOPICAL_OINTMENT | CUTANEOUS | Status: AC
  Filled 2017-10-09: qty 28.35

## 2017-10-09 MED ORDER — PROPOFOL 500 MG/50ML IV EMUL
INTRAVENOUS | Status: DC | PRN
  Administered 2017-10-09: 150 ug/kg/min via INTRAVENOUS
  Administered 2017-10-09: 11:00:00 via INTRAVENOUS

## 2017-10-09 MED ORDER — 0.9 % SODIUM CHLORIDE (POUR BTL) OPTIME
TOPICAL | Status: DC | PRN
  Administered 2017-10-09: 1000 mL

## 2017-10-09 MED ORDER — ARTIFICIAL TEARS OPHTHALMIC OINT
TOPICAL_OINTMENT | OPHTHALMIC | Status: AC
  Filled 2017-10-09: qty 3.5

## 2017-10-09 MED ORDER — FENTANYL CITRATE (PF) 250 MCG/5ML IJ SOLN
INTRAMUSCULAR | Status: AC
  Filled 2017-10-09: qty 5

## 2017-10-09 MED ORDER — SUGAMMADEX SODIUM 200 MG/2ML IV SOLN
INTRAVENOUS | Status: DC | PRN
  Administered 2017-10-09: 150 mg via INTRAVENOUS

## 2017-10-09 MED ORDER — ARTIFICIAL TEARS OPHTHALMIC OINT
TOPICAL_OINTMENT | OPHTHALMIC | Status: DC | PRN
  Administered 2017-10-09: 1 via OPHTHALMIC

## 2017-10-09 MED ORDER — OXYCODONE HCL 5 MG/5ML PO SOLN: 5.0000 mg | Freq: Once | ORAL | Status: DC | PRN

## 2017-10-09 MED ORDER — MORPHINE SULFATE (PF) 2 MG/ML IV SOLN: 2.0000 mg | INTRAVENOUS | Status: DC | PRN

## 2017-10-09 MED ORDER — FENTANYL CITRATE (PF) 250 MCG/5ML IJ SOLN
INTRAMUSCULAR | Status: DC | PRN
  Administered 2017-10-09 (×4): 50 ug via INTRAVENOUS

## 2017-10-09 MED ORDER — MIDAZOLAM HCL 5 MG/5ML IJ SOLN
INTRAMUSCULAR | Status: DC | PRN
  Administered 2017-10-09: 2 mg via INTRAVENOUS

## 2017-10-09 MED ORDER — DEXTROSE-NACL 5-0.45 % IV SOLN
INTRAVENOUS | Status: DC
  Administered 2017-10-09 – 2017-10-10 (×3): via INTRAVENOUS

## 2017-10-09 MED ORDER — DEXAMETHASONE SODIUM PHOSPHATE 10 MG/ML IJ SOLN
INTRAMUSCULAR | Status: AC
  Filled 2017-10-09: qty 1

## 2017-10-09 MED ORDER — PROPOFOL 10 MG/ML IV BOLUS
INTRAVENOUS | Status: AC
  Filled 2017-10-09: qty 20

## 2017-10-09 MED ORDER — DEXAMETHASONE SODIUM PHOSPHATE 10 MG/ML IJ SOLN
INTRAMUSCULAR | Status: DC | PRN
  Administered 2017-10-09: 10 mg via INTRAVENOUS

## 2017-10-09 MED ORDER — LIDOCAINE 2% (20 MG/ML) 5 ML SYRINGE
INTRAMUSCULAR | Status: AC
  Filled 2017-10-09: qty 5

## 2017-10-09 MED ORDER — LACTATED RINGERS IV SOLN
INTRAVENOUS | Status: DC
  Administered 2017-10-09: 09:00:00 via INTRAVENOUS

## 2017-10-09 MED ORDER — SCOPOLAMINE 1 MG/3DAYS TD PT72
MEDICATED_PATCH | TRANSDERMAL | Status: AC
  Filled 2017-10-09: qty 1

## 2017-10-09 MED ORDER — FENTANYL CITRATE (PF) 100 MCG/2ML IJ SOLN: 25.0000 ug | INTRAMUSCULAR | Status: DC | PRN

## 2017-10-09 MED ORDER — MIDAZOLAM HCL 2 MG/2ML IJ SOLN
INTRAMUSCULAR | Status: AC
  Filled 2017-10-09: qty 2

## 2017-10-09 SURGICAL SUPPLY — 58 items
ATTRACTOMAT 16X20 MAGNETIC DRP (DRAPES) ×2 IMPLANT
BLADE SURG 15 STRL LF DISP TIS (BLADE) ×1 IMPLANT
BLADE SURG 15 STRL SS (BLADE) ×1
CANISTER SUCT 3000ML PPV (MISCELLANEOUS) ×2 IMPLANT
CLEANER TIP ELECTROSURG 2X2 (MISCELLANEOUS) ×2 IMPLANT
CORD BIPOLAR FORCEPS 12FT (ELECTRODE) ×2 IMPLANT
COVER SURGICAL LIGHT HANDLE (MISCELLANEOUS) ×2 IMPLANT
COVER WAND RF STERILE (DRAPES) ×2 IMPLANT
CRADLE DONUT ADULT HEAD (MISCELLANEOUS) ×2 IMPLANT
DRAIN CHANNEL 15F RND FF W/TCR (WOUND CARE) IMPLANT
DRAIN HEMOVAC 1/8 X 5 (WOUND CARE) IMPLANT
DRAIN JACKSON PRATT 10MM FLAT (MISCELLANEOUS) IMPLANT
DRAIN TLS ROUND 10FR (DRAIN) ×2 IMPLANT
DRAPE HALF SHEET 40X57 (DRAPES) IMPLANT
DRSG TEGADERM 4X4.75 (GAUZE/BANDAGES/DRESSINGS) ×4 IMPLANT
ELECT COATED BLADE 2.86 ST (ELECTRODE) ×2 IMPLANT
ELECT REM PT RETURN 9FT ADLT (ELECTROSURGICAL) ×2
ELECTRODE REM PT RTRN 9FT ADLT (ELECTROSURGICAL) ×1 IMPLANT
EVACUATOR SILICONE 100CC (DRAIN) ×2 IMPLANT
FORCEPS BIPOLAR SPETZLER 8 1.0 (NEUROSURGERY SUPPLIES) ×2 IMPLANT
GAUZE 4X4 16PLY RFD (DISPOSABLE) IMPLANT
GAUZE SPONGE 4X4 12PLY STRL (GAUZE/BANDAGES/DRESSINGS) ×2 IMPLANT
GAUZE SPONGE 4X4 16PLY NS LF (WOUND CARE) ×2 IMPLANT
GAUZE SPONGE 4X4 16PLY XRAY LF (GAUZE/BANDAGES/DRESSINGS) ×2 IMPLANT
GLOVE BIO SURGEON STRL SZ 6.5 (GLOVE) ×2 IMPLANT
GLOVE ECLIPSE 7.5 STRL STRAW (GLOVE) ×2 IMPLANT
GOWN STRL REUS W/ TWL LRG LVL3 (GOWN DISPOSABLE) ×3 IMPLANT
GOWN STRL REUS W/TWL LRG LVL3 (GOWN DISPOSABLE) ×3
KIT BASIN OR (CUSTOM PROCEDURE TRAY) ×2 IMPLANT
KIT TURNOVER KIT B (KITS) ×2 IMPLANT
LOCATOR NERVE 3 VOLT (DISPOSABLE) IMPLANT
NEEDLE HYPO 25GX1X1/2 BEV (NEEDLE) IMPLANT
NS IRRIG 1000ML POUR BTL (IV SOLUTION) ×2 IMPLANT
PAD ARMBOARD 7.5X6 YLW CONV (MISCELLANEOUS) ×4 IMPLANT
PENCIL FOOT CONTROL (ELECTRODE) ×2 IMPLANT
SHEARS HARMONIC 9CM CVD (BLADE) ×2 IMPLANT
SPONGE INTESTINAL PEANUT (DISPOSABLE) ×2 IMPLANT
SPONGE LAP 18X18 X RAY DECT (DISPOSABLE) ×2 IMPLANT
STAPLER VISISTAT 35W (STAPLE) ×2 IMPLANT
SUT CHROMIC 3 0 SH 27 (SUTURE) ×2 IMPLANT
SUT CHROMIC 4 0 PS 2 18 (SUTURE) ×2 IMPLANT
SUT CHROMIC 5 0 P 3 (SUTURE) IMPLANT
SUT ETHIBOND 3 0 SH (SUTURE) IMPLANT
SUT ETHILON 3 0 PS 1 (SUTURE) ×2 IMPLANT
SUT ETHILON 5 0 PS 2 18 (SUTURE) ×2 IMPLANT
SUT SILK 2 0 (SUTURE) ×1
SUT SILK 2 0 SH CR/8 (SUTURE) ×2 IMPLANT
SUT SILK 2-0 18XBRD TIE 12 (SUTURE) ×1 IMPLANT
SUT SILK 3 0 TIES 17X18 (SUTURE) ×1
SUT SILK 3-0 18XBRD TIE BLK (SUTURE) ×1 IMPLANT
SUT SILK 4 0 (SUTURE) ×1
SUT SILK 4-0 18XBRD TIE 12 (SUTURE) ×1 IMPLANT
SUT VIC AB 3-0 FS2 27 (SUTURE) IMPLANT
SUT VIC AB 3-0 SH 18 (SUTURE) IMPLANT
TAPE PAPER 2X10 WHT MICROPORE (GAUZE/BANDAGES/DRESSINGS) ×2 IMPLANT
TRAY ENT MC OR (CUSTOM PROCEDURE TRAY) ×2 IMPLANT
TRAY FOLEY CATH SILVER 16FR (SET/KITS/TRAYS/PACK) ×2 IMPLANT
TUBE FEEDING 10FR FLEXIFLO (MISCELLANEOUS) IMPLANT

## 2017-10-09 NOTE — Transfer of Care (Signed)
Immediate Anesthesia Transfer of Care Note  Patient: Brooke Green  Procedure(s) Performed: RADICAL NECK DISSECTION (Right Neck)  Patient Location: PACU  Anesthesia Type:General  Level of Consciousness: awake, alert  and oriented  Airway & Oxygen Therapy: Patient Spontanous Breathing and Patient connected to nasal cannula oxygen  Post-op Assessment: Report given to RN, Post -op Vital signs reviewed and stable, Patient moving all extremities and Patient moving all extremities X 4  Post vital signs: Reviewed and stable  Last Vitals:  Vitals Value Taken Time  BP 125/79 10/09/2017 11:49 AM  Temp    Pulse 93 10/09/2017 11:53 AM  Resp 11 10/09/2017 11:53 AM  SpO2 99 % 10/09/2017 11:53 AM  Vitals shown include unvalidated device data.  Last Pain:  Vitals:   10/09/17 0816  TempSrc:   PainSc: 0-No pain      Patients Stated Pain Goal: 1 (02/77/41 2878)  Complications: No apparent anesthesia complications

## 2017-10-09 NOTE — Plan of Care (Signed)
  Problem: Pain Managment: Goal: General experience of comfort will improve Outcome: Progressing   Problem: Safety: Goal: Ability to remain free from injury will improve Outcome: Progressing   Problem: Skin Integrity: Goal: Risk for impaired skin integrity will decrease Outcome: Progressing   

## 2017-10-09 NOTE — Op Note (Signed)
Preop/postop diagnosis: Right neck mass Procedure: Right modified radical neck dissection Anesthesia: Gen. Estimated blood loss: Less than 50 mL Assistant: Rolena Infante Indications: 57 year old with a right neck mass that has been present for many years. 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 She had a needle biopsy of the noted a right neck that was positive for papillary carcinoma. CT scan showed several pathologic nodes in the right neck. The left neck was clear in the posterior and inferior neck of the right was clear. Patient was informed risk and benefits of the procedure and options were discussed. All questions are answered and consent was obtained. Patient had elected to make 2 separate incisions with the thyroid and neck rather than extending the thyroid incision. Procedure: Patient was taken out per placed in the supine position after general endotracheal tube anesthesia was placed in the left gaze position prepped and draped in usual sterile manner. An incision was outlined within a skin crease in the superior neck and opened with a 15 blade. Dissection was carried out down to the platysma muscle were inferior and superior subplatysmal flap was elevated. The dissection was begun along the sternocleidomastoid muscle dissecting the external jugular and dissecting down along the muscle to the point of the cervical plexus and spinal accessory nerve. The spinal accessory nerve was dissected and the superior tissue was removed passed underneath the nerve and then dissected the gutter down to the omohyoid muscle.. The dissection was then carried up superiorly where the submandibular gland was identified. The nodes and mass were in this location right at the edge of the digastric. The incision was made just below the submandibular gland and dissection was carried down to the anterior belly of the digastric. This was dissected inferiorly. The hypoglossal nerve was identified below the  digastric and preserved. Dissection was carried inferiorly along the strap muscles and then to the jugular vein. The jugular vein was dissected with the knife removing the fascia off of it and as well as the vagus nerve was identified. All both of these were preserved. The dissection was then brought up to the medial aspect of the spinal accessory nerve where it passed underneath the digastric muscle and this tissue was dissected and that freed up the specimen to be removed. The spinal accessory and I put also nerves were easily identified and preserved. Jugular vein preserved as well as sternocleidomastoid muscle. The wound was irrigated with saline and a #7 JP drain placed. The wound was closed with interrupted 4-0 chromic and a running 5-0 nylon. The drain was secured with a 3-0 nylon. Patient was awakened brought to recovery in stable condition counts correct

## 2017-10-09 NOTE — Anesthesia Preprocedure Evaluation (Signed)
Anesthesia Evaluation  Patient identified by MRN, date of birth, ID band Patient awake    Reviewed: Allergy & Precautions, H&P , NPO status , Patient's Chart, lab work & pertinent test results  History of Anesthesia Complications (+) PONV  Airway Mallampati: II   Neck ROM: full    Dental   Pulmonary neg pulmonary ROS,    breath sounds clear to auscultation       Cardiovascular + dysrhythmias  Rhythm:regular Rate:Normal     Neuro/Psych  Headaches, PSYCHIATRIC DISORDERS Anxiety    GI/Hepatic   Endo/Other  Hypothyroidism   Renal/GU      Musculoskeletal  (+) Arthritis ,   Abdominal   Peds  Hematology   Anesthesia Other Findings   Reproductive/Obstetrics                             Anesthesia Physical Anesthesia Plan  ASA: II  Anesthesia Plan: General   Post-op Pain Management:    Induction: Intravenous  PONV Risk Score and Plan: 4 or greater and Ondansetron, Dexamethasone, Midazolam and Treatment may vary due to age or medical condition  Airway Management Planned: Oral ETT  Additional Equipment:   Intra-op Plan:   Post-operative Plan: Extubation in OR  Informed Consent: I have reviewed the patients History and Physical, chart, labs and discussed the procedure including the risks, benefits and alternatives for the proposed anesthesia with the patient or authorized representative who has indicated his/her understanding and acceptance.     Plan Discussed with: CRNA, Anesthesiologist and Surgeon  Anesthesia Plan Comments:         Anesthesia Quick Evaluation

## 2017-10-09 NOTE — Anesthesia Procedure Notes (Signed)
Procedure Name: Intubation Date/Time: 10/09/2017 9:26 AM Performed by: Wilburn Cornelia, CRNA Pre-anesthesia Checklist: Patient identified, Emergency Drugs available, Suction available and Patient being monitored Patient Re-evaluated:Patient Re-evaluated prior to induction Oxygen Delivery Method: Circle System Utilized Preoxygenation: Pre-oxygenation with 100% oxygen Induction Type: IV induction Ventilation: Mask ventilation without difficulty and Oral airway inserted - appropriate to patient size Laryngoscope Size: Mac and 3 Grade View: Grade I Tube type: Oral Tube size: 6.5 mm Number of attempts: 1 Airway Equipment and Method: Stylet and Oral airway Placement Confirmation: ETT inserted through vocal cords under direct vision,  positive ETCO2 and breath sounds checked- equal and bilateral Secured at: 21 cm Tube secured with: Tape Dental Injury: Teeth and Oropharynx as per pre-operative assessment  Comments: [place] CRNA DL x 1 with MAC 3, Grade 3 view.

## 2017-10-09 NOTE — Progress Notes (Signed)
Pt new admit from PACU s/p right modified radical neck dissection with sutures, skin glued and JP drain, alert and oriented, no complain of pain at this time, due to void.

## 2017-10-09 NOTE — H&P (Signed)
Brooke Green is an 57 y.o. female.   Chief Complaint: right neck mass HPI: hx of thyropid cancer and right neck disease. She had Ct scan and now ready for right neck dissection  Past Medical History:  Diagnosis Date  . Anemia   . Anxiety    no problems in years since menopause  . Arthritis    bilateral hands  . Cancer ([place])    skin cancer removed right neck, left arm; thyroid cancer  . Constipation   . Constipation   . Diverticulitis   . Dizziness   . Dysrhythmia 2015   9 mo. duration of elevated blood pressure, tachycardia; followed by Dr. Caryl Comes possibly related to virus  . Fatigue   . Hypothyroid   . PONV (postoperative nausea and vomiting)   . Seasonal allergies   . Thyroid disease     Past Surgical History:  Procedure Laterality Date  . APPENDECTOMY  1967  . CESAREAN SECTION  1996  . THYROIDECTOMY N/A 08/16/2017   Procedure: TOTAL THYROIDECTOMY WITH LIMITED LYMPH NODE DISSECTION;  Surgeon: Armandina Gemma, MD;  Location: [facility];  Service: General;  Laterality: N/A;    Family History  Problem Relation Age of Onset  . Thyroid cancer Mother   . Breast cancer Mother   . Skin cancer Mother   . Atrial fibrillation Mother   . Hypertension Mother   . Melanoma Father    Social History:  reports that she has never smoked. She has never used smokeless tobacco. She reports that she drinks alcohol. She reports that she does not use drugs.  Allergies:  Allergies  Allergen Reactions  . Doxycycline Nausea Only    Medications Prior to Admission  Medication Sig Dispense Refill  . CALCIUM PO Take 1 tablet by mouth daily.    . fluticasone (FLONASE) 50 MCG/ACT nasal spray Place 1 spray into both nostrils daily as needed for allergies.     Marland Kitchen levothyroxine (SYNTHROID, LEVOTHROID) 100 MCG tablet Take 100 mcg by mouth daily before breakfast.    . loratadine (CLARITIN) 10 MG tablet Take 10 mg by mouth daily as needed for allergies.     . Multiple Vitamins-Minerals (WOMENS MULTIVITAMIN  PLUS PO) Take 1 tablet by mouth daily.    . calcium carbonate (TUMS) 500 MG chewable tablet Chew 2 tablets (400 mg of elemental calcium total) by mouth 3 (three) times daily. (Patient not taking: Reported on 10/02/2017) 90 tablet 1  . meclizine (ANTIVERT) 12.5 MG tablet Take 1 tablet (12.5 mg total) by mouth 3 (three) times daily as needed for dizziness. 30 tablet 6  . traMADol (ULTRAM) 50 MG tablet Take 1-2 tablets (50-100 mg total) by mouth every 6 (six) hours as needed for moderate pain. (Patient not taking: Reported on 10/02/2017) 15 tablet 0    No results found for this or any previous visit (from the past 48 hour(s)). No results found.  Review of Systems  Constitutional: Negative.   HENT: Negative.   Eyes: Negative.   Respiratory: Negative.   Cardiovascular: Negative.   Skin: Negative.     Blood pressure 122/84, pulse 93, temperature 97.9 F (36.6 C), temperature source Oral, resp. rate 20, height 5' 5.5" (1.664 m), weight 63.5 kg, last menstrual period 07/03/2008, SpO2 99 %. Physical Exam  Constitutional: She appears well-developed and well-nourished.  HENT:  Head: Normocephalic and atraumatic.  Nose: Nose normal.  Mouth/Throat: Oropharynx is clear and moist.  Eyes: Pupils are equal, round, and reactive to light. Conjunctivae are normal.  Neck: Normal range of motion. Neck supple.  Cardiovascular: Normal rate.  Respiratory: Effort normal.  GI: Soft.  Musculoskeletal: Normal range of motion.  Lymphadenopathy:    She has cervical adenopathy.     Assessment/Plan Right neck mass- she has papillary thyroid cancer in the right neck. Discussed right neck dissection and ready to proceed  Melissa Montane, MD 10/09/2017, 8:35 AM

## 2017-10-10 ENCOUNTER — Encounter (HOSPITAL_COMMUNITY): Payer: Self-pay | Admitting: Otolaryngology

## 2017-10-10 DIAGNOSIS — C770 Secondary and unspecified malignant neoplasm of lymph nodes of head, face and neck: Secondary | ICD-10-CM | POA: Diagnosis not present

## 2017-10-10 LAB — HIV ANTIBODY (ROUTINE TESTING W REFLEX): HIV Screen 4th Generation wRfx: NONREACTIVE

## 2017-10-10 NOTE — Discharge Summary (Signed)
Physician Discharge Summary  Patient ID: Brooke Green MRN: [id number] DOB/AGE: 58-Jun-1962 58 y.o.  Admit date: 10/09/2017 Discharge date: 10/10/2017  Admission Diagnoses: right papillary carcinoma neck  Discharge Diagnoses: same Active Problems:   Mass of right side of neck   Discharged Condition: good  Hospital Course: patient was admitted after right neck dissection for papillary carcinoma of the lymph nodes. She did well. She is taking fluids well. Pain is completely controlled. She did not have any nausea and vomiting as her previous operation. She had too much drainage to remove the drain. She has a slight weakness of the right lower lip. Shoulder works well. Wound looks excellent. She was discharged to follow-up tomorrow for drain removal.  Consults: None  Significant Diagnostic Studies: 0  Treatments: surgery: right modified radical neck dissection  Discharge Exam: Blood pressure (!) 95/50, pulse (!) 52, temperature 98.3 F (36.8 C), temperature source Oral, resp. rate 12, height 5' 5.5" (1.664 m), weight 63.5 kg, last menstrual period 07/03/2008, SpO2 95 %. awake and alert. Wound looks excellent. No evidence of infection or hematoma. Drain is intact. Right lower lip is weak. Heart is regular. Lungs are clear. Abdomen is soft. Extremities no tenderness or swelling. Voice is normal.  Disposition:    Patient to be discharged to home with the drain. She'll be taught how to use the drain and empty it. She'll measure the amount. She will follow-up tomorrow in the office for drain removal and wound check.   Signed: Melissa Montane 10/10/2017, 8:04 AM

## 2017-10-10 NOTE — Progress Notes (Signed)
Patient given discharge instructions and verbalized understanding. Patient left unit in stable condition with nursing staff.

## 2017-10-10 NOTE — Anesthesia Postprocedure Evaluation (Signed)
Anesthesia Post Note  Patient: Brooke Green  Procedure(s) Performed: RADICAL NECK DISSECTION (Right Neck)     Patient location during evaluation: PACU Anesthesia Type: General Level of consciousness: awake and alert Pain management: pain level controlled Vital Signs Assessment: post-procedure vital signs reviewed and stable Respiratory status: spontaneous breathing, nonlabored ventilation, respiratory function stable and patient connected to nasal cannula oxygen Cardiovascular status: blood pressure returned to baseline and stable Postop Assessment: no apparent nausea or vomiting Anesthetic complications: no    Last Vitals:  Vitals:   10/10/17 0127 10/10/17 0621  BP: (!) 88/59 (!) 95/50  Pulse: (!) 59 (!) 52  Resp: 14 12  Temp: 36.9 C 36.8 C  SpO2: 96% 95%    Last Pain:  Vitals:   10/10/17 0645  TempSrc:   PainSc: 0-No pain                 Micaiah Litle S

## 2017-11-05 DIAGNOSIS — C73 Malignant neoplasm of thyroid gland: Secondary | ICD-10-CM | POA: Diagnosis not present

## 2017-11-12 DIAGNOSIS — Z808 Family history of malignant neoplasm of other organs or systems: Secondary | ICD-10-CM | POA: Diagnosis not present

## 2017-11-12 DIAGNOSIS — C73 Malignant neoplasm of thyroid gland: Secondary | ICD-10-CM | POA: Diagnosis not present

## 2017-11-12 DIAGNOSIS — E890 Postprocedural hypothyroidism: Secondary | ICD-10-CM | POA: Diagnosis not present

## 2017-11-15 ENCOUNTER — Other Ambulatory Visit (HOSPITAL_COMMUNITY): Payer: Self-pay | Admitting: Endocrinology

## 2017-11-15 DIAGNOSIS — C73 Malignant neoplasm of thyroid gland: Secondary | ICD-10-CM

## 2017-12-10 ENCOUNTER — Encounter (HOSPITAL_COMMUNITY)
Admission: RE | Admit: 2017-12-10 | Discharge: 2017-12-10 | Disposition: A | Discharge status: Home / Self Care | Payer: 59 | Source: Ambulatory Visit | Attending: Endocrinology | Admitting: Endocrinology

## 2017-12-10 DIAGNOSIS — C73 Malignant neoplasm of thyroid gland: Secondary | ICD-10-CM | POA: Diagnosis not present

## 2017-12-10 MED ORDER — THYROTROPIN ALFA 1.1 MG IM SOLR
0.9000 mg | INTRAMUSCULAR | Status: AC
  Administered 2017-12-10: 0.9 mg via INTRAMUSCULAR

## 2017-12-10 MED ORDER — STERILE WATER FOR INJECTION IJ SOLN
INTRAMUSCULAR | Status: AC
  Filled 2017-12-10: qty 10

## 2017-12-11 ENCOUNTER — Encounter (HOSPITAL_COMMUNITY)
Admission: RE | Admit: 2017-12-11 | Discharge: 2017-12-11 | Disposition: A | Discharge status: Home / Self Care | Payer: 59 | Source: Ambulatory Visit | Attending: Endocrinology | Admitting: Endocrinology

## 2017-12-11 DIAGNOSIS — C73 Malignant neoplasm of thyroid gland: Secondary | ICD-10-CM | POA: Diagnosis not present

## 2017-12-11 MED ORDER — THYROTROPIN ALFA 1.1 MG IM SOLR
0.9000 mg | INTRAMUSCULAR | Status: AC
  Administered 2017-12-11: 0.9 mg via INTRAMUSCULAR

## 2017-12-11 MED ORDER — STERILE WATER FOR INJECTION IJ SOLN
INTRAMUSCULAR | Status: AC
  Filled 2017-12-11: qty 10

## 2017-12-12 ENCOUNTER — Encounter (HOSPITAL_COMMUNITY)
Admission: RE | Admit: 2017-12-12 | Discharge: 2017-12-12 | Disposition: A | Discharge status: Home / Self Care | Payer: 59 | Source: Ambulatory Visit | Attending: Endocrinology | Admitting: Endocrinology

## 2017-12-12 DIAGNOSIS — C73 Malignant neoplasm of thyroid gland: Secondary | ICD-10-CM | POA: Diagnosis not present

## 2017-12-12 MED ORDER — SODIUM IODIDE I 131 CAPSULE
125.0000 | Freq: Once | INTRAVENOUS | Status: AC
  Administered 2017-12-12: 125 via ORAL

## 2017-12-19 ENCOUNTER — Ambulatory Visit (HOSPITAL_COMMUNITY)
Admission: RE | Admit: 2017-12-19 | Discharge: 2017-12-19 | Disposition: A | Discharge status: Home / Self Care | Payer: 59 | Source: Ambulatory Visit | Attending: Endocrinology | Admitting: Endocrinology

## 2017-12-19 DIAGNOSIS — C73 Malignant neoplasm of thyroid gland: Secondary | ICD-10-CM

## 2018-01-07 DIAGNOSIS — H11821 Conjunctivochalasis, right eye: Secondary | ICD-10-CM | POA: Diagnosis not present

## 2018-02-13 DIAGNOSIS — E032 Hypothyroidism due to medicaments and other exogenous substances: Secondary | ICD-10-CM | POA: Diagnosis not present

## 2018-02-13 DIAGNOSIS — Z9889 Other specified postprocedural states: Secondary | ICD-10-CM | POA: Diagnosis not present

## 2018-03-21 DIAGNOSIS — E890 Postprocedural hypothyroidism: Secondary | ICD-10-CM | POA: Diagnosis not present

## 2018-03-21 DIAGNOSIS — M79643 Pain in unspecified hand: Secondary | ICD-10-CM | POA: Diagnosis not present

## 2018-03-21 DIAGNOSIS — I7300 Raynaud's syndrome without gangrene: Secondary | ICD-10-CM | POA: Diagnosis not present

## 2018-03-21 DIAGNOSIS — M1990 Unspecified osteoarthritis, unspecified site: Secondary | ICD-10-CM | POA: Diagnosis not present

## 2018-03-21 DIAGNOSIS — C73 Malignant neoplasm of thyroid gland: Secondary | ICD-10-CM | POA: Diagnosis not present

## 2018-03-22 DIAGNOSIS — H40013 Open angle with borderline findings, low risk, bilateral: Secondary | ICD-10-CM | POA: Diagnosis not present

## 2018-03-28 DIAGNOSIS — C73 Malignant neoplasm of thyroid gland: Secondary | ICD-10-CM | POA: Diagnosis not present

## 2018-03-28 DIAGNOSIS — E890 Postprocedural hypothyroidism: Secondary | ICD-10-CM | POA: Diagnosis not present

## 2018-05-03 DIAGNOSIS — E039 Hypothyroidism, unspecified: Secondary | ICD-10-CM | POA: Diagnosis not present

## 2018-05-10 DIAGNOSIS — C73 Malignant neoplasm of thyroid gland: Secondary | ICD-10-CM | POA: Diagnosis not present

## 2018-05-10 DIAGNOSIS — E890 Postprocedural hypothyroidism: Secondary | ICD-10-CM | POA: Diagnosis not present

## 2018-05-10 DIAGNOSIS — Z808 Family history of malignant neoplasm of other organs or systems: Secondary | ICD-10-CM | POA: Diagnosis not present

## 2018-05-22 DIAGNOSIS — Z9009 Acquired absence of other part of head and neck: Secondary | ICD-10-CM | POA: Diagnosis not present

## 2018-11-28 ENCOUNTER — Other Ambulatory Visit (HOSPITAL_COMMUNITY): Payer: Self-pay | Admitting: Endocrinology

## 2018-11-28 DIAGNOSIS — C73 Malignant neoplasm of thyroid gland: Secondary | ICD-10-CM

## 2018-12-16 ENCOUNTER — Encounter (HOSPITAL_COMMUNITY)
Admission: RE | Admit: 2018-12-16 | Discharge: 2018-12-16 | Disposition: A | Discharge status: Home / Self Care | Payer: 59 | Source: Ambulatory Visit | Attending: Endocrinology | Admitting: Endocrinology

## 2018-12-16 ENCOUNTER — Other Ambulatory Visit: Payer: Self-pay

## 2018-12-16 DIAGNOSIS — C73 Malignant neoplasm of thyroid gland: Secondary | ICD-10-CM | POA: Diagnosis present

## 2018-12-16 MED ORDER — THYROTROPIN ALFA 1.1 MG IM SOLR
0.9000 mg | INTRAMUSCULAR | Status: AC
  Administered 2018-12-16: 0.9 mg via INTRAMUSCULAR

## 2018-12-16 MED ORDER — STERILE WATER FOR INJECTION IJ SOLN
INTRAMUSCULAR | Status: AC
  Filled 2018-12-16: qty 10

## 2018-12-17 ENCOUNTER — Encounter (HOSPITAL_COMMUNITY)
Admission: RE | Admit: 2018-12-17 | Discharge: 2018-12-17 | Disposition: A | Discharge status: Home / Self Care | Payer: 59 | Source: Ambulatory Visit | Attending: Endocrinology | Admitting: Endocrinology

## 2018-12-17 DIAGNOSIS — C73 Malignant neoplasm of thyroid gland: Secondary | ICD-10-CM | POA: Diagnosis not present

## 2018-12-17 MED ORDER — STERILE WATER FOR INJECTION IJ SOLN
INTRAMUSCULAR | Status: AC
  Filled 2018-12-17: qty 10

## 2018-12-17 MED ORDER — THYROTROPIN ALFA 1.1 MG IM SOLR
0.9000 mg | INTRAMUSCULAR | Status: AC
  Administered 2018-12-17: 0.9 mg via INTRAMUSCULAR

## 2018-12-18 ENCOUNTER — Encounter (HOSPITAL_COMMUNITY)
Admission: RE | Admit: 2018-12-18 | Discharge: 2018-12-18 | Disposition: A | Discharge status: Home / Self Care | Payer: 59 | Source: Ambulatory Visit | Attending: Endocrinology | Admitting: Endocrinology

## 2018-12-19 DIAGNOSIS — C73 Malignant neoplasm of thyroid gland: Secondary | ICD-10-CM | POA: Diagnosis not present

## 2018-12-20 ENCOUNTER — Encounter (HOSPITAL_COMMUNITY)
Admission: RE | Admit: 2018-12-20 | Discharge: 2018-12-20 | Disposition: A | Discharge status: Home / Self Care | Payer: 59 | Source: Ambulatory Visit | Attending: Endocrinology | Admitting: Endocrinology

## 2018-12-20 ENCOUNTER — Other Ambulatory Visit: Payer: Self-pay

## 2018-12-20 DIAGNOSIS — C73 Malignant neoplasm of thyroid gland: Secondary | ICD-10-CM | POA: Diagnosis not present

## 2018-12-20 MED ORDER — SODIUM IODIDE I 131 CAPSULE
4.0000 | Freq: Once | INTRAVENOUS | Status: AC | PRN
  Administered 2018-12-19: 4 via ORAL

## 2019-01-19 ENCOUNTER — Ambulatory Visit: Payer: 59

## 2019-02-10 DIAGNOSIS — R000 Tachycardia, unspecified: Secondary | ICD-10-CM | POA: Diagnosis not present

## 2019-02-10 DIAGNOSIS — Z20828 Contact with and (suspected) exposure to other viral communicable diseases: Secondary | ICD-10-CM | POA: Diagnosis not present

## 2019-02-10 DIAGNOSIS — E038 Other specified hypothyroidism: Secondary | ICD-10-CM | POA: Diagnosis not present

## 2019-02-11 DIAGNOSIS — Z20828 Contact with and (suspected) exposure to other viral communicable diseases: Secondary | ICD-10-CM | POA: Diagnosis not present

## 2019-02-11 DIAGNOSIS — Z03818 Encounter for observation for suspected exposure to other biological agents ruled out: Secondary | ICD-10-CM | POA: Diagnosis not present

## 2019-02-14 DIAGNOSIS — R000 Tachycardia, unspecified: Secondary | ICD-10-CM | POA: Diagnosis not present

## 2019-02-14 DIAGNOSIS — E038 Other specified hypothyroidism: Secondary | ICD-10-CM | POA: Diagnosis not present

## 2019-02-24 ENCOUNTER — Ambulatory Visit: Payer: Self-pay | Attending: Internal Medicine

## 2019-02-24 DIAGNOSIS — Z23 Encounter for immunization: Secondary | ICD-10-CM | POA: Insufficient documentation

## 2019-02-24 NOTE — Progress Notes (Signed)
   Covid-19 Vaccination Clinic  Name:  Brooke Green    MRN: [id number] DOB: January 28, 1960  02/24/2019  [name] was observed post Covid-19 immunization for 15 minutes without incidence. She was provided with Vaccine Information Sheet and instruction to access the V-Safe system.   [name] was instructed to call 911 with any severe reactions post vaccine: Marland Kitchen Difficulty breathing  . Swelling of your face and throat  . A fast heartbeat  . A bad rash all over your body  . Dizziness and weakness    Immunizations Administered    Name Date Dose VIS Date Route   Pfizer COVID-19 Vaccine 02/24/2019  6:12 PM 0.3 mL 12/20/2018 Intramuscular   Manufacturer: [place]   Lot: [id number]   [place]: [id number]

## 2019-03-18 ENCOUNTER — Ambulatory Visit: Payer: Self-pay | Attending: Internal Medicine

## 2019-03-18 DIAGNOSIS — Z23 Encounter for immunization: Secondary | ICD-10-CM

## 2019-03-18 NOTE — Progress Notes (Signed)
   Covid-19 Vaccination Clinic  Name:  Brooke Green    MRN: [id number] DOB: October 23, 1960  03/18/2019  [name] was observed post Covid-19 immunization for 15 minutes without incident. She was provided with Vaccine Information Sheet and instruction to access the V-Safe system.   [name] was instructed to call 911 with any severe reactions post vaccine: Marland Kitchen Difficulty breathing  . Swelling of face and throat  . A fast heartbeat  . A bad rash all over body  . Dizziness and weakness   Immunizations Administered    Name Date Dose VIS Date Route   Pfizer COVID-19 Vaccine 03/18/2019 10:00 AM 0.3 mL 12/20/2018 Intramuscular   Manufacturer: [place]   Lot: [id number]   [place]: [id number]

## 2019-03-19 ENCOUNTER — Ambulatory Visit: Payer: Self-pay

## 2019-03-25 DIAGNOSIS — H40013 Open angle with borderline findings, low risk, bilateral: Secondary | ICD-10-CM | POA: Diagnosis not present

## 2019-03-27 DIAGNOSIS — M19042 Primary osteoarthritis, left hand: Secondary | ICD-10-CM | POA: Diagnosis not present

## 2019-03-27 DIAGNOSIS — M79643 Pain in unspecified hand: Secondary | ICD-10-CM | POA: Diagnosis not present

## 2019-03-27 DIAGNOSIS — M79641 Pain in right hand: Secondary | ICD-10-CM | POA: Diagnosis not present

## 2019-03-27 DIAGNOSIS — M8580 Other specified disorders of bone density and structure, unspecified site: Secondary | ICD-10-CM | POA: Diagnosis not present

## 2019-03-27 DIAGNOSIS — M1990 Unspecified osteoarthritis, unspecified site: Secondary | ICD-10-CM | POA: Diagnosis not present

## 2019-03-27 DIAGNOSIS — I7300 Raynaud's syndrome without gangrene: Secondary | ICD-10-CM | POA: Diagnosis not present

## 2019-03-27 DIAGNOSIS — M79642 Pain in left hand: Secondary | ICD-10-CM | POA: Diagnosis not present

## 2019-03-27 DIAGNOSIS — M19041 Primary osteoarthritis, right hand: Secondary | ICD-10-CM | POA: Diagnosis not present

## 2019-04-28 DIAGNOSIS — T148XXA Other injury of unspecified body region, initial encounter: Secondary | ICD-10-CM | POA: Diagnosis not present

## 2019-05-15 DIAGNOSIS — C73 Malignant neoplasm of thyroid gland: Secondary | ICD-10-CM | POA: Diagnosis not present

## 2019-05-22 DIAGNOSIS — C73 Malignant neoplasm of thyroid gland: Secondary | ICD-10-CM | POA: Diagnosis not present

## 2019-05-22 DIAGNOSIS — M8580 Other specified disorders of bone density and structure, unspecified site: Secondary | ICD-10-CM | POA: Diagnosis not present

## 2019-05-22 DIAGNOSIS — E890 Postprocedural hypothyroidism: Secondary | ICD-10-CM | POA: Diagnosis not present

## 2019-05-27 ENCOUNTER — Telehealth: Payer: Self-pay

## 2019-05-27 NOTE — Telephone Encounter (Signed)
NOTES ON FILE FROM  [facility] [phone] REFERRAL SENT TO SCHEDULING 

## 2019-07-28 DIAGNOSIS — H11441 Conjunctival cysts, right eye: Secondary | ICD-10-CM | POA: Diagnosis not present

## 2019-09-30 DIAGNOSIS — H40013 Open angle with borderline findings, low risk, bilateral: Secondary | ICD-10-CM | POA: Diagnosis not present

## 2019-10-03 DIAGNOSIS — L820 Inflamed seborrheic keratosis: Secondary | ICD-10-CM | POA: Diagnosis not present

## 2019-10-03 DIAGNOSIS — D2262 Melanocytic nevi of left upper limb, including shoulder: Secondary | ICD-10-CM | POA: Diagnosis not present

## 2019-10-03 DIAGNOSIS — L814 Other melanin hyperpigmentation: Secondary | ICD-10-CM | POA: Diagnosis not present

## 2019-10-03 DIAGNOSIS — D1801 Hemangioma of skin and subcutaneous tissue: Secondary | ICD-10-CM | POA: Diagnosis not present

## 2019-10-03 DIAGNOSIS — L819 Disorder of pigmentation, unspecified: Secondary | ICD-10-CM | POA: Diagnosis not present

## 2019-11-07 DIAGNOSIS — C73 Malignant neoplasm of thyroid gland: Secondary | ICD-10-CM | POA: Diagnosis not present

## 2019-11-14 DIAGNOSIS — E890 Postprocedural hypothyroidism: Secondary | ICD-10-CM | POA: Diagnosis not present

## 2019-11-14 DIAGNOSIS — M8580 Other specified disorders of bone density and structure, unspecified site: Secondary | ICD-10-CM | POA: Diagnosis not present

## 2019-11-14 DIAGNOSIS — C73 Malignant neoplasm of thyroid gland: Secondary | ICD-10-CM | POA: Diagnosis not present

## 2019-11-15 DIAGNOSIS — S60221A Contusion of right hand, initial encounter: Secondary | ICD-10-CM | POA: Diagnosis not present

## 2019-11-15 DIAGNOSIS — S6991XA Unspecified injury of right wrist, hand and finger(s), initial encounter: Secondary | ICD-10-CM | POA: Diagnosis not present

## 2019-11-20 ENCOUNTER — Other Ambulatory Visit (HOSPITAL_COMMUNITY): Payer: Self-pay | Admitting: Endocrinology

## 2019-11-20 DIAGNOSIS — C73 Malignant neoplasm of thyroid gland: Secondary | ICD-10-CM

## 2019-11-21 ENCOUNTER — Telehealth: Payer: Self-pay

## 2019-11-21 NOTE — Telephone Encounter (Signed)
NOTES ON FILE FROM [facility] [phone] SENT REFERRAL TO SCHEDULING

## 2019-12-18 DIAGNOSIS — R002 Palpitations: Secondary | ICD-10-CM | POA: Insufficient documentation

## 2019-12-19 ENCOUNTER — Telehealth: Payer: Self-pay | Admitting: Radiology

## 2019-12-19 ENCOUNTER — Encounter: Payer: Self-pay | Admitting: Internal Medicine

## 2019-12-19 ENCOUNTER — Other Ambulatory Visit: Payer: Self-pay

## 2019-12-19 ENCOUNTER — Ambulatory Visit (INDEPENDENT_AMBULATORY_CARE_PROVIDER_SITE_OTHER): Payer: BC Managed Care – PPO | Admitting: Internal Medicine

## 2019-12-19 DIAGNOSIS — R002 Palpitations: Secondary | ICD-10-CM | POA: Diagnosis not present

## 2019-12-19 NOTE — Progress Notes (Signed)
Breast can incorporate     ELECTROPHYSIOLOGY CONSULT NOTE  Patient ID: Brooke Green, MRN: [id number], DOB/AGE: Apr 28, 1960 59 y.o. Admit date: (Not on file) Date of Consult: 12/19/2019  Primary Physician: Carol Ada, MD Primary Cardiologist:  Pervis Hocking is a 59 y.o. female who is being seen today for the evaluation of tachypalpitations at the request of Dr  Tamala Julian.    HPI Brooke Green is a 59 y.o. female referred for palpitations.  She had been seen years ago for complex symptoms including nausea and orthostatic stress.  Symptoms abated.  Of late, she has 2 new palpitation issues.  The 1st is that on her Fitbit she notes that routinely with exercise in her heart rate will be recorded in the 160-180 range for period of time.  No associated symptoms.  Unaware of it apart from her Fitbit.  The 2nd is flutters and flips.  Noticed most when she is in bed.  Mostly when she is on her back.  Has eliminated caffeine without any appreciable change.  In her mind her palpitations were triggered when her insurance company required her to go from d.a.w. Synthroid to generic.  She then had to go to a 2nd generic and with this began feeling palpitations.  Ultimately she has been able to get back on d.a.w. Synthroid with a significant attenuation    Date Cr K TSH Hgb  5/21 0.8 5.2 0.5               Past Medical History:  Diagnosis Date  . Anemia   . Anxiety    no problems in years since menopause  . Arthritis    bilateral hands  . Cancer ([place])    skin cancer removed right neck, left arm; thyroid cancer  . Constipation   . Constipation   . Diverticulitis   . Dizziness   . Dysrhythmia 2015   9 mo. duration of elevated blood pressure, tachycardia; followed by Dr. Caryl Comes possibly related to virus  . Fatigue   . Hypothyroid   . PONV (postoperative nausea and vomiting)   . Seasonal allergies   . Thyroid disease       Surgical History:  Past Surgical History:   Procedure Laterality Date  . APPENDECTOMY  1967  . CESAREAN SECTION  1996  . RADICAL NECK DISSECTION  10/09/2017  . RADICAL NECK DISSECTION Right 10/09/2017   Procedure: RADICAL NECK DISSECTION;  Surgeon: Melissa Montane, MD;  Location: [place];  Service: ENT;  Laterality: Right;  . THYROIDECTOMY N/A 08/16/2017   Procedure: TOTAL THYROIDECTOMY WITH LIMITED LYMPH NODE DISSECTION;  Surgeon: Armandina Gemma, MD;  Location: [facility];  Service: General;  Laterality: N/A;     Home Meds: Current Meds  Medication Sig  . CALCIUM PO Take 1 tablet by mouth daily.  . fluticasone (FLONASE) 50 MCG/ACT nasal spray Place 1 spray into both nostrils daily as needed for allergies.   Marland Kitchen loratadine (CLARITIN) 10 MG tablet Take 10 mg by mouth daily as needed for allergies.   Marland Kitchen meclizine (ANTIVERT) 12.5 MG tablet Take 1 tablet (12.5 mg total) by mouth 3 (three) times daily as needed for dizziness.  . Multiple Vitamins-Minerals (WOMENS MULTIVITAMIN PLUS PO) Take 1 tablet by mouth daily.  Marland Kitchen SYNTHROID 112 MCG tablet Take 112 mcg by mouth daily.    Allergies:  Allergies  Allergen Reactions  . Doxycycline Nausea Only    Social History   Socioeconomic History  .  Marital status: Married    Spouse name: Not on file  . Number of children: Not on file  . Years of education: Not on file  . Highest education level: Not on file  Occupational History  . Not on file  Tobacco Use  . Smoking status: Never Smoker  . Smokeless tobacco: Never Used  Vaping Use  . Vaping Use: Never used  Substance and Sexual Activity  . Alcohol use: Yes    Comment: rarely  . Drug use: No  . Sexual activity: Not on file  Other Topics Concern  . Not on file  Social History Narrative  . Not on file   Social Determinants of Health   Financial Resource Strain: Not on file  Food Insecurity: Not on file  Transportation Needs: Not on file  Physical Activity: Not on file  Stress: Not on file  Social Connections: Not on file  Intimate  Partner Violence: Not on file     Family History  Problem Relation Age of Onset  . Thyroid cancer Mother   . Breast cancer Mother   . Skin cancer Mother   . Atrial fibrillation Mother   . Hypertension Mother   . Melanoma Father      ROS:  Please see the history of present illness.     All other systems reviewed and negative.    Physical Exam:  Blood pressure 100/78, pulse 83, height 5' 5.5" (1.664 m), weight 140 lb 9.6 oz (63.8 kg), last menstrual period 07/03/2008. General: Well developed, well nourished female in no acute distress. Head: Normocephalic, atraumatic, sclera non-icteric, no xanthomas, nares are without discharge. EENT: normal  Lymph Nodes:  none Neck: Negative for carotid bruits. JVD not elevated. Back:without scoliosis kyphosis  Lungs: Clear bilaterally to auscultation without wheezes, rales, or rhonchi. Breathing is unlabored. Heart: RRR with S1 S2. No murmur . No rubs, or gallops appreciated. Abdomen: Soft, non-tender, non-distended with normoactive bowel sounds. No hepatomegaly. No rebound/guarding. No obvious abdominal masses. Msk:  Strength and tone appear normal for age. Extremities: No clubbing or cyanosis. No edema.  Distal pedal pulses are 2+ and equal bilaterally. Skin: Warm and Dry Neuro: Alert and oriented X 3. CN III-XII intact Grossly normal sensory and motor function . Psych:  Responds to questions appropriately with a normal affect.      Labs: Cardiac Enzymes No results for input(s): CKTOTAL, CKMB, TROPONINI in the last 72 hours. CBC Lab Results  Component Value Date   WBC 5.7 10/04/2017   HGB 13.6 10/04/2017   HCT 42.2 10/04/2017   MCV 92.5 10/04/2017   PLT 191 10/04/2017   PROTIME: No results for input(s): LABPROT, INR in the last 72 hours. Chemistry No results for input(s): NA, K, CL, CO2, BUN, CREATININE, CALCIUM, PROT, BILITOT, ALKPHOS, ALT, AST, GLUCOSE in the last 168 hours.  Invalid input(s): LABALBU Lipids No results found  for: CHOL, HDL, LDLCALC, TRIG BNP No results found for: PROBNP Thyroid Function Tests: No results for input(s): TSH, T4TOTAL, T3FREE, THYROIDAB in the last 72 hours.  Invalid input(s): FREET3 Miscellaneous No results found for: DDIMER  Radiology/Studies:  No results found.  EKG: sinus @ 83 13/07/38   Assessment and Plan:  Palpitations   The patient has a right palpitations syndromes.  She is most concerned about the tachycardia as outlined by her Fitbit.  We will use a Zio patch to elucidate.  I have been in touch with [place].  Once the order is placed, we will reach out to  ZIO and will change the criteria for detection on this particular patient.   Virl Axe

## 2019-12-19 NOTE — Patient Instructions (Signed)
Medication Instructions:  Your physician recommends that you continue on your current medications as directed. Please refer to the Current Medication list given to you today.  *If you need a refill on your cardiac medications before your next appointment, please call your pharmacy*   Lab Work: None ordered.  If you have labs (blood work) drawn today and your tests are completely normal, you will receive your results only by: Marland Kitchen MyChart Message (if you have MyChart) OR . A paper copy in the mail If you have any lab test that is abnormal or we need to change your treatment, we will call you to review the results.   Testing/Procedures: [place]- Long Term Monitor Instructions   Your physician has requested you wear your ZIO patch monitor_______days.   This is a single patch monitor.  Irhythm supplies one patch monitor per enrollment.  Additional stickers are not available.   Please do not apply patch if you will be having a Nuclear Stress Test, Echocardiogram, Cardiac CT, MRI, or Chest Xray during the time frame you would be wearing the monitor. The patch cannot be worn during these tests.  You cannot remove and re-apply the ZIO XT patch monitor.   Your ZIO patch monitor will be sent USPS Priority mail from [facility] directly to your home address. The monitor may also be mailed to a PO BOX if home delivery is not available.   It may take 3-5 days to receive your monitor after you have been enrolled.   Once you have received you monitor, please review enclosed instructions.  Your monitor has already been registered assigning a specific monitor serial # to you.   Applying the monitor   Shave hair from upper left chest.   Hold abrader disc by orange tab.  Rub abrader in 40 strokes over left upper chest as indicated in your monitor instructions.   Clean area with 4 enclosed alcohol pads .  Use all pads to assure are is cleaned thoroughly.  Let dry.   Apply patch as indicated in  monitor instructions.  Patch will be place under collarbone on left side of chest with arrow pointing upward.   Rub patch adhesive wings for 2 minutes.Remove white label marked "1".  Remove white label marked "2".  Rub patch adhesive wings for 2 additional minutes.   While looking in a mirror, press and release button in center of patch.  A small green light will flash 3-4 times .  This will be your only indicator the monitor has been turned on.     Do not shower for the first 24 hours.  You may shower after the first 24 hours.   Press button if you feel a symptom. You will hear a small click.  Record Date, Time and Symptom in the Patient Log Book.   When you are ready to remove patch, follow instructions on last 2 pages of Patient Log Book.  Stick patch monitor onto last page of Patient Log Book.   Place Patient Log Book in [place] box.  Use locking tab on box and tape box closed securely.  The [employer] and [employer] has [employer] on it.  Please place in mailbox as soon as possible.  Your physician should have your test results approximately 7 days after the monitor has been mailed back to [facility].   Call [place] at [phone] if you have questions regarding your ZIO XT patch monitor.  Call them immediately if you see  an orange light blinking on your monitor.   If your monitor falls off in less than 4 days contact our Monitor department at [phone].  If your monitor becomes loose or falls off after 4 days call [employer] at [phone] for suggestions on securing your monitor.     Follow-Up: At [facility], you and your health needs are our priority.  As part of our continuing mission to provide you with exceptional heart care, we have created designated Provider Care Teams.  These Care Teams include your primary Cardiologist (physician) and Advanced Practice Providers (APPs -  Physician Assistants and Nurse Practitioners) who all work together to provide  you with the care you need, when you need it.  We recommend signing up for the patient portal called "MyChart".  Sign up information is provided on this After Visit Summary.  MyChart is used to connect with patients for Virtual Visits (Telemedicine).  Patients are able to view lab/test results, encounter notes, upcoming appointments, etc.  Non-urgent messages can be sent to your provider as well.   To learn more about what you can do with MyChart, go to [email or web address].    Your next appointment:   4 week(s)  The format for your next appointment:   Virtual Visit   Provider:   Virl Axe, MD

## 2019-12-19 NOTE — Telephone Encounter (Signed)
Enrolled patient for a 14 day Zio XT Monitor to be mailed to patients home  

## 2020-01-01 ENCOUNTER — Other Ambulatory Visit: Payer: BC Managed Care – PPO

## 2020-01-01 DIAGNOSIS — Z20822 Contact with and (suspected) exposure to covid-19: Secondary | ICD-10-CM | POA: Diagnosis not present

## 2020-01-03 LAB — SARS-COV-2, NAA 2 DAY TAT

## 2020-01-03 LAB — NOVEL CORONAVIRUS, NAA: SARS-CoV-2, NAA: NOT DETECTED

## 2020-01-06 ENCOUNTER — Ambulatory Visit (INDEPENDENT_AMBULATORY_CARE_PROVIDER_SITE_OTHER): Payer: BC Managed Care – PPO

## 2020-01-06 DIAGNOSIS — R002 Palpitations: Secondary | ICD-10-CM | POA: Diagnosis not present

## 2020-01-22 ENCOUNTER — Telehealth: Payer: BC Managed Care – PPO | Admitting: Internal Medicine

## 2020-01-26 ENCOUNTER — Other Ambulatory Visit: Payer: Self-pay

## 2020-01-26 ENCOUNTER — Encounter (HOSPITAL_COMMUNITY)
Admission: RE | Admit: 2020-01-26 | Discharge: 2020-01-26 | Disposition: A | Discharge status: Home / Self Care | Payer: BC Managed Care – PPO | Source: Ambulatory Visit | Attending: Endocrinology | Admitting: Endocrinology

## 2020-01-26 DIAGNOSIS — C73 Malignant neoplasm of thyroid gland: Secondary | ICD-10-CM | POA: Diagnosis not present

## 2020-01-26 MED ORDER — STERILE WATER FOR INJECTION IJ SOLN
INTRAMUSCULAR | Status: AC
  Administered 2020-01-26: 10 mL
  Filled 2020-01-26: qty 10

## 2020-01-26 MED ORDER — THYROTROPIN ALFA 0.9 MG IM SOLR
0.9000 mg | INTRAMUSCULAR | Status: AC
  Administered 2020-01-26: 0.9 mg via INTRAMUSCULAR

## 2020-01-27 ENCOUNTER — Encounter (HOSPITAL_COMMUNITY)
Admission: RE | Admit: 2020-01-27 | Discharge: 2020-01-27 | Disposition: A | Discharge status: Home / Self Care | Payer: BC Managed Care – PPO | Source: Ambulatory Visit | Attending: Endocrinology | Admitting: Endocrinology

## 2020-01-27 MED ORDER — STERILE WATER FOR INJECTION IJ SOLN
INTRAMUSCULAR | Status: AC
  Filled 2020-01-27: qty 10

## 2020-01-27 NOTE — Progress Notes (Signed)
Second thyrogen injection given in the left gluteal region

## 2020-01-28 ENCOUNTER — Encounter (HOSPITAL_COMMUNITY)
Admission: RE | Admit: 2020-01-28 | Discharge: 2020-01-28 | Disposition: A | Discharge status: Home / Self Care | Payer: BC Managed Care – PPO | Source: Ambulatory Visit | Attending: Endocrinology | Admitting: Endocrinology

## 2020-01-28 ENCOUNTER — Other Ambulatory Visit: Payer: Self-pay

## 2020-01-30 ENCOUNTER — Other Ambulatory Visit: Payer: Self-pay

## 2020-01-30 ENCOUNTER — Encounter (HOSPITAL_COMMUNITY)
Admission: RE | Admit: 2020-01-30 | Discharge: 2020-01-30 | Disposition: A | Discharge status: Home / Self Care | Payer: BC Managed Care – PPO | Source: Ambulatory Visit | Attending: Endocrinology | Admitting: Endocrinology

## 2020-01-30 DIAGNOSIS — E890 Postprocedural hypothyroidism: Secondary | ICD-10-CM | POA: Diagnosis not present

## 2020-01-30 DIAGNOSIS — C73 Malignant neoplasm of thyroid gland: Secondary | ICD-10-CM | POA: Diagnosis not present

## 2020-01-30 DIAGNOSIS — Z9889 Other specified postprocedural states: Secondary | ICD-10-CM | POA: Diagnosis not present

## 2020-01-30 MED ORDER — SODIUM IODIDE I 131 CAPSULE
3.9000 | Freq: Once | INTRAVENOUS | Status: AC | PRN
  Administered 2020-01-30: 3.9 via ORAL

## 2020-01-30 MED ORDER — SODIUM IODIDE I 131 CAPSULE
3.9000 | Freq: Once | INTRAVENOUS | Status: AC | PRN
  Administered 2020-01-28: 3.9 via ORAL

## 2020-02-03 DIAGNOSIS — C73 Malignant neoplasm of thyroid gland: Secondary | ICD-10-CM | POA: Diagnosis not present

## 2020-02-18 ENCOUNTER — Telehealth: Payer: Self-pay

## 2020-02-18 DIAGNOSIS — Z1231 Encounter for screening mammogram for malignant neoplasm of breast: Secondary | ICD-10-CM | POA: Diagnosis not present

## 2020-02-18 DIAGNOSIS — Z6823 Body mass index (BMI) 23.0-23.9, adult: Secondary | ICD-10-CM | POA: Diagnosis not present

## 2020-02-18 DIAGNOSIS — Z01419 Encounter for gynecological examination (general) (routine) without abnormal findings: Secondary | ICD-10-CM | POA: Diagnosis not present

## 2020-02-18 NOTE — Telephone Encounter (Addendum)
Attempted phone call to pt.  Voicemail is full, unable to leave voicemail message.  Will send monitor results through MyChart message.

## 2020-02-24 DIAGNOSIS — I8312 Varicose veins of left lower extremity with inflammation: Secondary | ICD-10-CM | POA: Diagnosis not present

## 2020-03-04 ENCOUNTER — Other Ambulatory Visit: Payer: Self-pay

## 2020-03-04 ENCOUNTER — Telehealth (INDEPENDENT_AMBULATORY_CARE_PROVIDER_SITE_OTHER): Payer: BC Managed Care – PPO | Admitting: Internal Medicine

## 2020-03-04 VITALS — Ht 65.5 in | Wt 140.0 lb

## 2020-03-04 DIAGNOSIS — Z1321 Encounter for screening for nutritional disorder: Secondary | ICD-10-CM | POA: Diagnosis not present

## 2020-03-04 DIAGNOSIS — R002 Palpitations: Secondary | ICD-10-CM

## 2020-03-04 DIAGNOSIS — Z13228 Encounter for screening for other metabolic disorders: Secondary | ICD-10-CM | POA: Diagnosis not present

## 2020-03-04 DIAGNOSIS — Z13220 Encounter for screening for lipoid disorders: Secondary | ICD-10-CM | POA: Diagnosis not present

## 2020-03-04 NOTE — Progress Notes (Signed)
Electrophysiology TeleHealth Note   Due to national recommendations of social distancing due to COVID 19, an audio/video telehealth visit is felt to be most appropriate for this patient at this time.  See MyChart message from today for the patient's consent to telehealth for [facility].   Date:  03/04/2020   ID:  Brooke Green, DOB 1960/01/23, MRN [id number]  Location: patient's home  Provider location: [address] [place]  Evaluation Performed: Follow-up visit  PCP:  Carol Ada, MD  Cardiologist:    Electrophysiologist:  SK   Chief Complaint:  Palpitations   History of Present Illness:    Brooke Green is a 60 y.o. female who presents via audio/video conferencing for a telehealth visit today.  Since last being seen in our clinic for palpitations and orthostasis   the patient reports  Fewer palpitations and no lightheadedness   Reviewed event recorder     The patient denies symptoms of fevers, chills, cough, or new SOB worrisome for COVID 19.    Past Medical History:  Diagnosis Date  . Anemia   . Anxiety    no problems in years since menopause  . Arthritis    bilateral hands  . Cancer ([place])    skin cancer removed right neck, left arm; thyroid cancer  . Constipation   . Constipation   . Diverticulitis   . Dizziness   . Dysrhythmia 2015   9 mo. duration of elevated blood pressure, tachycardia; followed by Dr. Caryl Comes possibly related to virus  . Fatigue   . Hypothyroid   . PONV (postoperative nausea and vomiting)   . Seasonal allergies   . Thyroid disease     Past Surgical History:  Procedure Laterality Date  . APPENDECTOMY  1967  . CESAREAN SECTION  1996  . RADICAL NECK DISSECTION  10/09/2017  . RADICAL NECK DISSECTION Right 10/09/2017   Procedure: RADICAL NECK DISSECTION;  Surgeon: Melissa Montane, MD;  Location: [place];  Service: ENT;  Laterality: Right;  . THYROIDECTOMY N/A 08/16/2017   Procedure: TOTAL THYROIDECTOMY WITH LIMITED LYMPH  NODE DISSECTION;  Surgeon: Armandina Gemma, MD;  [facility];  Service: General;  Laterality: N/A;    Current Outpatient Medications  Medication Sig Dispense Refill  . CALCIUM PO Take 1 tablet by mouth daily.    . fluticasone (FLONASE) 50 MCG/ACT nasal spray Place 1 spray into both nostrils daily as needed for allergies.     Marland Kitchen levocetirizine (XYZAL) 5 MG tablet Take 5 mg by mouth every evening.    . loratadine (CLARITIN) 10 MG tablet Take 10 mg by mouth daily as needed for allergies.     Marland Kitchen meclizine (ANTIVERT) 12.5 MG tablet Take 1 tablet (12.5 mg total) by mouth 3 (three) times daily as needed for dizziness. 30 tablet 6  . Multiple Vitamins-Minerals (WOMENS MULTIVITAMIN PLUS PO) Take 1 tablet by mouth daily.    Marland Kitchen SYNTHROID 112 MCG tablet Take 112 mcg by mouth daily.     No current facility-administered medications for this visit.    Allergies:   Doxycycline   Social History:  The patient  reports that she has never smoked. She has never used smokeless tobacco. She reports current alcohol use. She reports that she does not use drugs.   Family History:  The patient's   family history includes Atrial fibrillation in her mother; Breast cancer in her mother; Hypertension in her mother; Melanoma in her father; Skin cancer in her mother; Thyroid  cancer in her mother.   ROS:  Please see the history of present illness.   All other systems are personally reviewed and negative.    Exam:    Vital Signs:  Ht 5' 5.5" (1.664 m)   Wt 140 lb (63.5 kg)   LMP 07/03/2008 (LMP Unknown)   BMI 22.94 kg/m      Labs/Other Tests and Data Reviewed:    Recent Labs: No results found for requested labs within last 8760 hours.   Wt Readings from Last 3 Encounters:  03/04/20 140 lb (63.5 kg)  12/19/19 140 lb 9.6 oz (63.8 kg)  10/09/17 140 lb (63.5 kg)     Other studies personally reviewed: Additional studies/ records that were reviewed today include:    ASSESSMENT & PLAN:    Palpitations     SVT nonsustained   Orthostatic lightheadedness    Overall much improved   Reviewed monitor and tacchycardia with exercise might be related to the SVTNS identified   LH abated  No changes  COVID 19 screen The patient denies symptoms of COVID 19 at this time.  The importance of social distancing was discussed today.  Follow-up:  52month     Current medicines are reviewed at length with the patient today.   The patient does not have concerns regarding her medicines.  The following changes were made today:  none  Labs/ tests ordered today include:   No orders of the defined types were placed in this encounter.   Future tests ( post COVID )     Patient Risk:  after full review of this patients clinical status, I feel that they are at moderate  risk at this time.  Today, I have spent 12* minutes with the patient with telehealth technology discussing the above.  Signed, Virl Axe, MD  03/04/2020 2:19 PM     [address] [place] [address] [phone] (office) [phone] (fax)

## 2020-03-04 NOTE — Patient Instructions (Signed)
Medication Instructions:  Your physician recommends that you continue on your current medications as directed. Please refer to the Current Medication list given to you today.  *If you need a refill on your cardiac medications before your next appointment, please call your pharmacy*   Lab Work: None ordered.  If you have labs (blood work) drawn today and your tests are completely normal, you will receive your results only by: Marland Kitchen MyChart Message (if you have MyChart) OR . A paper copy in the mail If you have any lab test that is abnormal or we need to change your treatment, we will call you to review the results.   Testing/Procedures: None ordered.    Follow-Up: At [facility], you and your health needs are our priority.  As part of our continuing mission to provide you with exceptional heart care, we have created designated Provider Care Teams.  These Care Teams include your primary Cardiologist (physician) and Advanced Practice Providers (APPs -  Physician Assistants and Nurse Practitioners) who all work together to provide you with the care you need, when you need it.  We recommend signing up for the patient portal called "MyChart".  Sign up information is provided on this After Visit Summary.  MyChart is used to connect with patients for Virtual Visits (Telemedicine).  Patients are able to view lab/test results, encounter notes, upcoming appointments, etc.  Non-urgent messages can be sent to your provider as well.   To learn more about what you can do with MyChart, go to [email or web address].    Your next appointment:   12 months in person with Dr Caryl Comes

## 2020-04-14 DIAGNOSIS — M1990 Unspecified osteoarthritis, unspecified site: Secondary | ICD-10-CM | POA: Diagnosis not present

## 2020-04-14 DIAGNOSIS — M8580 Other specified disorders of bone density and structure, unspecified site: Secondary | ICD-10-CM | POA: Diagnosis not present

## 2020-04-14 DIAGNOSIS — M79643 Pain in unspecified hand: Secondary | ICD-10-CM | POA: Diagnosis not present

## 2020-04-14 DIAGNOSIS — I7300 Raynaud's syndrome without gangrene: Secondary | ICD-10-CM | POA: Diagnosis not present

## 2020-04-30 DIAGNOSIS — H40013 Open angle with borderline findings, low risk, bilateral: Secondary | ICD-10-CM | POA: Diagnosis not present

## 2020-05-20 DIAGNOSIS — E7800 Pure hypercholesterolemia, unspecified: Secondary | ICD-10-CM | POA: Diagnosis not present

## 2020-10-21 DIAGNOSIS — H40013 Open angle with borderline findings, low risk, bilateral: Secondary | ICD-10-CM | POA: Diagnosis not present

## 2020-11-04 DIAGNOSIS — M8580 Other specified disorders of bone density and structure, unspecified site: Secondary | ICD-10-CM | POA: Diagnosis not present

## 2020-11-04 DIAGNOSIS — E890 Postprocedural hypothyroidism: Secondary | ICD-10-CM | POA: Diagnosis not present

## 2020-11-04 DIAGNOSIS — C73 Malignant neoplasm of thyroid gland: Secondary | ICD-10-CM | POA: Diagnosis not present

## 2020-11-11 DIAGNOSIS — M816 Localized osteoporosis [Lequesne]: Secondary | ICD-10-CM | POA: Diagnosis not present

## 2020-11-11 DIAGNOSIS — N958 Other specified menopausal and perimenopausal disorders: Secondary | ICD-10-CM | POA: Diagnosis not present

## 2020-11-11 DIAGNOSIS — Z13820 Encounter for screening for osteoporosis: Secondary | ICD-10-CM | POA: Diagnosis not present

## 2020-11-11 DIAGNOSIS — E039 Hypothyroidism, unspecified: Secondary | ICD-10-CM | POA: Diagnosis not present

## 2020-11-17 DIAGNOSIS — M8580 Other specified disorders of bone density and structure, unspecified site: Secondary | ICD-10-CM | POA: Diagnosis not present

## 2020-11-17 DIAGNOSIS — Z23 Encounter for immunization: Secondary | ICD-10-CM | POA: Diagnosis not present

## 2020-11-17 DIAGNOSIS — C73 Malignant neoplasm of thyroid gland: Secondary | ICD-10-CM | POA: Diagnosis not present

## 2020-11-17 DIAGNOSIS — E890 Postprocedural hypothyroidism: Secondary | ICD-10-CM | POA: Diagnosis not present

## 2020-12-08 DIAGNOSIS — M810 Age-related osteoporosis without current pathological fracture: Secondary | ICD-10-CM | POA: Diagnosis not present

## 2021-01-12 DIAGNOSIS — D224 Melanocytic nevi of scalp and neck: Secondary | ICD-10-CM | POA: Diagnosis not present

## 2021-01-12 DIAGNOSIS — D2261 Melanocytic nevi of right upper limb, including shoulder: Secondary | ICD-10-CM | POA: Diagnosis not present

## 2021-01-12 DIAGNOSIS — D2262 Melanocytic nevi of left upper limb, including shoulder: Secondary | ICD-10-CM | POA: Diagnosis not present

## 2021-01-12 DIAGNOSIS — D2272 Melanocytic nevi of left lower limb, including hip: Secondary | ICD-10-CM | POA: Diagnosis not present

## 2021-02-02 IMAGING — NM NM [ID] THYROID CANCER METS WHOLE BODY W/ THYROGEN
6 series · 6 of 6 positions shown · non-contrast
Comparison: 12/30/2017

CLINICAL DATA: Thyroid cancer post thyroidectomy and postoperative
radioactive iodine ablation

EXAM:
THYROGEN-STIMULATED X-VSV WHOLE BODY SCAN
TECHNIQUE: The patient received 0.9 mg Thyrogen intramuscularly every 24 hours
for two doses. On the third day the patient returned and received
the radiopharmaceutical, per orally. On the fifth day, the patient
returned and whole body planar images were obtained in the anterior
and posterior projections.
RADIOPHARMACEUTICALS:  4 mCi X-VSV sodium iodide orally

[Series 1: marker · 4.14mm/px · 1 of 1 slices shown (1 of 2)]
[im 1/1  full-range]
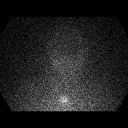

[Series 1: marker · 4.14mm/px · 1 of 1 slices shown (2 of 2)]
[im 1/1]
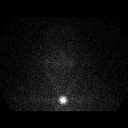

[Series 2: static thyroid no marker · 4.14mm/px · 1 of 1 slices shown (1 of 2)]
[im 1/1  full-range]
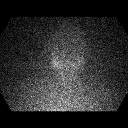

[Series 2: static thyroid no marker · 4.14mm/px · 1 of 1 slices shown (2 of 2)]
[im 1/1  full-range]
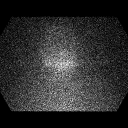

[Series 3: i131 whole body · 2.66mm/px · 1 of 1 slices shown (1 of 2)]
[im 1/1  full-range]
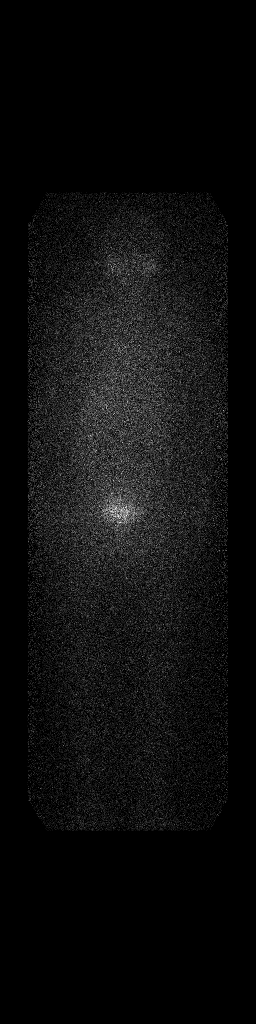

[Series 3: i131 whole body · 2.66mm/px · 1 of 1 slices shown (2 of 2)]
[im 1/1  full-range]
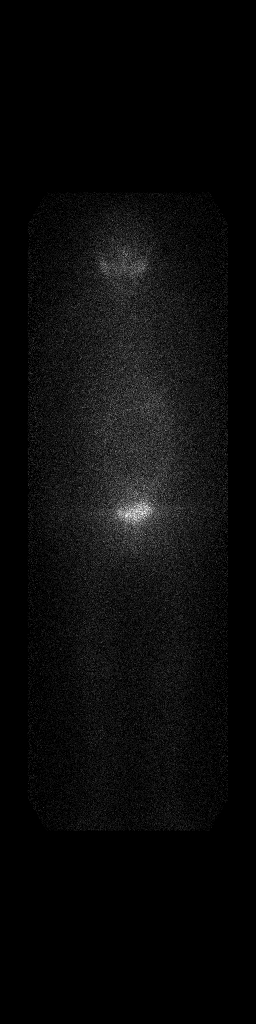

[6 of 6 positions shown; findings below may reference images not displayed]

FINDINGS: Excreted tracer within distal colon and urinary bladder.

Resolution of previously identified uptake in the cervical region
consistent with ablation of thyroid remnant.

No new sites of abnormal osseous tracer accumulation are identified
to suggest iodine-avid metastatic thyroid cancer.
IMPRESSION: No scintigraphic evidence of iodine-avid metastatic thyroid cancer.

## 2021-02-24 DIAGNOSIS — Z01419 Encounter for gynecological examination (general) (routine) without abnormal findings: Secondary | ICD-10-CM | POA: Diagnosis not present

## 2021-02-24 DIAGNOSIS — Z1231 Encounter for screening mammogram for malignant neoplasm of breast: Secondary | ICD-10-CM | POA: Diagnosis not present

## 2021-02-24 DIAGNOSIS — Z6823 Body mass index (BMI) 23.0-23.9, adult: Secondary | ICD-10-CM | POA: Diagnosis not present

## 2021-03-08 DIAGNOSIS — H00014 Hordeolum externum left upper eyelid: Secondary | ICD-10-CM | POA: Diagnosis not present

## 2021-03-10 DIAGNOSIS — Z0000 Encounter for general adult medical examination without abnormal findings: Secondary | ICD-10-CM | POA: Diagnosis not present

## 2021-03-10 DIAGNOSIS — Z23 Encounter for immunization: Secondary | ICD-10-CM | POA: Diagnosis not present

## 2021-03-10 DIAGNOSIS — H00024 Hordeolum internum left upper eyelid: Secondary | ICD-10-CM | POA: Diagnosis not present

## 2021-03-10 DIAGNOSIS — Z13220 Encounter for screening for lipoid disorders: Secondary | ICD-10-CM | POA: Diagnosis not present

## 2021-03-11 DIAGNOSIS — H00024 Hordeolum internum left upper eyelid: Secondary | ICD-10-CM | POA: Diagnosis not present

## 2021-03-11 DIAGNOSIS — H0102A Squamous blepharitis right eye, upper and lower eyelids: Secondary | ICD-10-CM | POA: Diagnosis not present

## 2021-03-11 DIAGNOSIS — H0102B Squamous blepharitis left eye, upper and lower eyelids: Secondary | ICD-10-CM | POA: Diagnosis not present

## 2021-04-14 DIAGNOSIS — M1990 Unspecified osteoarthritis, unspecified site: Secondary | ICD-10-CM | POA: Diagnosis not present

## 2021-04-14 DIAGNOSIS — M79643 Pain in unspecified hand: Secondary | ICD-10-CM | POA: Diagnosis not present

## 2021-04-14 DIAGNOSIS — M8580 Other specified disorders of bone density and structure, unspecified site: Secondary | ICD-10-CM | POA: Diagnosis not present

## 2021-04-14 DIAGNOSIS — I7300 Raynaud's syndrome without gangrene: Secondary | ICD-10-CM | POA: Diagnosis not present

## 2021-06-08 ENCOUNTER — Ambulatory Visit: Payer: BC Managed Care – PPO | Admitting: Internal Medicine

## 2021-06-08 DIAGNOSIS — Z1211 Encounter for screening for malignant neoplasm of colon: Secondary | ICD-10-CM | POA: Diagnosis not present

## 2021-06-08 DIAGNOSIS — K5730 Diverticulosis of large intestine without perforation or abscess without bleeding: Secondary | ICD-10-CM | POA: Diagnosis not present

## 2021-06-10 DIAGNOSIS — I471 Supraventricular tachycardia: Secondary | ICD-10-CM | POA: Insufficient documentation

## 2021-06-10 DIAGNOSIS — R42 Dizziness and giddiness: Secondary | ICD-10-CM | POA: Insufficient documentation

## 2021-06-15 ENCOUNTER — Encounter: Payer: Self-pay | Admitting: Internal Medicine

## 2021-06-15 ENCOUNTER — Ambulatory Visit (INDEPENDENT_AMBULATORY_CARE_PROVIDER_SITE_OTHER): Payer: BC Managed Care – PPO | Admitting: Internal Medicine

## 2021-06-15 VITALS — BP 102/58 | HR 80 | Ht 65.5 in | Wt 140.8 lb

## 2021-06-15 DIAGNOSIS — R42 Dizziness and giddiness: Secondary | ICD-10-CM | POA: Diagnosis not present

## 2021-06-15 DIAGNOSIS — I471 Supraventricular tachycardia: Secondary | ICD-10-CM

## 2021-06-15 DIAGNOSIS — R002 Palpitations: Secondary | ICD-10-CM | POA: Diagnosis not present

## 2021-06-15 NOTE — Patient Instructions (Signed)
Medication Instructions:  Your physician recommends that you continue on your current medications as directed. Please refer to the Current Medication list given to you today.  *If you need a refill on your cardiac medications before your next appointment, please call your pharmacy*   Lab Work: None ordered.  If you have labs (blood work) drawn today and your tests are completely normal, you will receive your results only by: [place] (if you have MyChart) OR A paper copy in the mail If you have any lab test that is abnormal or we need to change your treatment, we will call you to review the results.   Testing/Procedures: None ordered.    Follow-Up: At [facility], you and your health needs are our priority.  As part of our continuing mission to provide you with exceptional heart care, we have created designated Provider Care Teams.  These Care Teams include your primary Cardiologist (physician) and Advanced Practice Providers (APPs -  Physician Assistants and Nurse Practitioners) who all work together to provide you with the care you need, when you need it.  We recommend signing up for the patient portal called "MyChart".  Sign up information is provided on this After Visit Summary.  MyChart is used to connect with patients for Virtual Visits (Telemedicine).  Patients are able to view lab/test results, encounter notes, upcoming appointments, etc.  Non-urgent messages can be sent to your provider as well.   To learn more about what you can do with MyChart, go to [email or web address].    Your next appointment:   2 years with Dr Caryl Comes  Important Information About Sugar

## 2021-06-15 NOTE — Progress Notes (Signed)
Patient Care Team: Carol Ada, MD as PCP - General (Family Medicine)   HPI  Brooke Green is a 61 y.o. female  Seen in follow-up for palpitations.  Remotely had more complicated symptom complex including nausea with orthostatic stress.  1/22 event recorder most of her symptoms, fluttering and pounding were associated with sinus rhythm.  Triggered events were associated with sinus rhythm and occasional PAC with isolated and couplets.  Palpitations are ameliorated by lying on her right side.  Mostly not too problematic   Date Cr K TSH Hgb  5/21 0.8 5.2 0.5     3/23 0.87 4.5    13.6     Records and Results Reviewed   Past Medical History:  Diagnosis Date   Anemia    Anxiety    no problems in years since menopause   Arthritis    bilateral hands   Cancer ([place])    skin cancer removed right neck, left arm; thyroid cancer   Constipation    Constipation    Diverticulitis    Dizziness    Dysrhythmia 2015   9 mo. duration of elevated blood pressure, tachycardia; followed by Dr. Caryl Comes possibly related to virus   Fatigue    Hypothyroid    PONV (postoperative nausea and vomiting)    Seasonal allergies    Thyroid disease     Past Surgical History:  Procedure Laterality Date   [place]  10/09/2017   RADICAL NECK DISSECTION Right 10/09/2017   Procedure: RADICAL NECK DISSECTION;  Surgeon: Melissa Montane, MD;  Location: [facility];  Service: ENT;  Laterality: Right;   THYROIDECTOMY N/A 08/16/2017   Procedure: TOTAL THYROIDECTOMY WITH LIMITED LYMPH NODE DISSECTION;  Surgeon: Armandina Gemma, MD;  Location: [facility];  Service: General;  Laterality: N/A;    Current Meds  Medication Sig   CALCIUM PO Take 1 tablet by mouth daily.   fluticasone (FLONASE) 50 MCG/ACT nasal spray Place 1 spray into both nostrils daily as needed for allergies.    levocetirizine (XYZAL) 5 MG tablet Take 5 mg by mouth every evening.   loratadine  (CLARITIN) 10 MG tablet Take 10 mg by mouth daily as needed for allergies.    meclizine (ANTIVERT) 12.5 MG tablet Take 1 tablet (12.5 mg total) by mouth 3 (three) times daily as needed for dizziness.   Multiple Vitamins-Minerals (WOMENS MULTIVITAMIN PLUS PO) Take 1 tablet by mouth daily.   SYNTHROID 112 MCG tablet Take 112 mcg by mouth daily.    Allergies  Allergen Reactions   Doxycycline Nausea Only      Review of Systems negative except from HPI and PMH  Physical Exam BP (!) 102/58   Pulse 80   Ht 5' 5.5" (1.664 m)   Wt 140 lb 12.8 oz (63.9 kg)   LMP 07/03/2008 (LMP Unknown)   SpO2 97%   BMI 23.07 kg/m  Well developed and well nourished in no acute distress HENT normal E scleral and icterus clear Neck Supple JVP flat; carotids brisk and full Clear to ausculation Regular rate and rhythm, no murmurs gallops or rub Soft with active bowel sounds No clubbing cyanosis  Edema Alert and oriented, grossly normal motor and sensory function Skin Warm and Dry  ECG sinus at 80 Intervals 14/07/38  CrCl cannot be calculated (Patient's most recent lab result is older than the maximum 21 days allowed.).   Assessment and  Plan  Palpitations    Mostly quiescient.  Discussed the potential benefits of magnesium.  She is taking some magnesium already in the context of her calcium supplements.  Suggested a target of about 250 mg daily.    Current medicines are reviewed at length with the patient today .  The patient does not  have concerns regarding medicines.

## 2021-11-08 DIAGNOSIS — H40013 Open angle with borderline findings, low risk, bilateral: Secondary | ICD-10-CM | POA: Diagnosis not present

## 2021-11-24 DIAGNOSIS — M810 Age-related osteoporosis without current pathological fracture: Secondary | ICD-10-CM | POA: Diagnosis not present

## 2021-11-24 DIAGNOSIS — C73 Malignant neoplasm of thyroid gland: Secondary | ICD-10-CM | POA: Diagnosis not present

## 2021-11-24 DIAGNOSIS — E890 Postprocedural hypothyroidism: Secondary | ICD-10-CM | POA: Diagnosis not present

## 2021-11-28 DIAGNOSIS — C73 Malignant neoplasm of thyroid gland: Secondary | ICD-10-CM | POA: Diagnosis not present

## 2021-11-28 DIAGNOSIS — E890 Postprocedural hypothyroidism: Secondary | ICD-10-CM | POA: Diagnosis not present

## 2022-01-27 DIAGNOSIS — Z85828 Personal history of other malignant neoplasm of skin: Secondary | ICD-10-CM | POA: Diagnosis not present

## 2022-01-27 DIAGNOSIS — L986 Other infiltrative disorders of the skin and subcutaneous tissue: Secondary | ICD-10-CM | POA: Diagnosis not present

## 2022-01-27 DIAGNOSIS — L821 Other seborrheic keratosis: Secondary | ICD-10-CM | POA: Diagnosis not present

## 2022-01-27 DIAGNOSIS — L738 Other specified follicular disorders: Secondary | ICD-10-CM | POA: Diagnosis not present

## 2022-01-27 DIAGNOSIS — L820 Inflamed seborrheic keratosis: Secondary | ICD-10-CM | POA: Diagnosis not present

## 2022-01-27 DIAGNOSIS — D485 Neoplasm of uncertain behavior of skin: Secondary | ICD-10-CM | POA: Diagnosis not present

## 2022-01-27 DIAGNOSIS — D2262 Melanocytic nevi of left upper limb, including shoulder: Secondary | ICD-10-CM | POA: Diagnosis not present

## 2022-02-14 DIAGNOSIS — D487 Neoplasm of uncertain behavior of other specified sites: Secondary | ICD-10-CM | POA: Diagnosis not present

## 2022-02-14 DIAGNOSIS — D485 Neoplasm of uncertain behavior of skin: Secondary | ICD-10-CM | POA: Diagnosis not present

## 2022-03-23 DIAGNOSIS — Z1159 Encounter for screening for other viral diseases: Secondary | ICD-10-CM | POA: Diagnosis not present

## 2022-03-23 DIAGNOSIS — E7800 Pure hypercholesterolemia, unspecified: Secondary | ICD-10-CM | POA: Diagnosis not present

## 2022-03-23 DIAGNOSIS — E039 Hypothyroidism, unspecified: Secondary | ICD-10-CM | POA: Diagnosis not present

## 2022-03-23 DIAGNOSIS — Z0000 Encounter for general adult medical examination without abnormal findings: Secondary | ICD-10-CM | POA: Diagnosis not present

## 2022-03-28 ENCOUNTER — Ambulatory Visit (INDEPENDENT_AMBULATORY_CARE_PROVIDER_SITE_OTHER): Payer: BC Managed Care – PPO | Admitting: Sports Medicine

## 2022-03-28 VITALS — BP 110/78 | Ht 65.0 in | Wt 135.0 lb

## 2022-03-28 DIAGNOSIS — M7541 Impingement syndrome of right shoulder: Secondary | ICD-10-CM | POA: Diagnosis not present

## 2022-03-28 NOTE — Progress Notes (Signed)
   Subjective:    Patient ID: Brooke Green, female    DOB: 1960/11/25, 62 y.o.   MRN: [id number]  HPI chief complaint: Right shoulder pain  Brooke Green is a very pleasant 62 year old female that presents today complaining of 2 weeks of lateral right shoulder pain that began after she helped her parents move.  She is very active and enjoys Pilates and weight training.  Over the past couple of weeks she has noticed increasing discomfort with certain Pilates related activities involving arm abduction.  She is also beginning to experience discomfort with activities of daily living such as reaching out away from her body to hold objects or push things away from her body.  She describes it as an achy discomfort and a pulling sensation.  She does endorse some pain at night as well.  She has tried some topical medications which have not been effective.  She has also tried ibuprofen which was initially effective but was less effective last night.  She thinks she may have tried some Aleve but is unsure of that.  She denies significant problems with the shoulder in the past.  Pain does radiate into the proximal humerus but not past the elbow.  No prior shoulder surgeries.  Past medical history reviewed Medications reviewed Allergies reviewed    Review of Systems As above    Objective:   Physical Exam  Well-developed, well-nourished.  No acute distress.  Right shoulder: Full range of motion with a positive painful arc.  Slight tenderness to palpation over the [facility] joint and bicipital groove.  Positive empty can.  Rotator cuff strength is 5/5.  Negative O'Brien's.  Negative speeds, negative Yergason's.  Neurovascularly intact distally.      Assessment & Plan:   Right shoulder pain secondary to rotator cuff impingement  We are going to start with a home exercise program consisting of Jobe exercises and Scap stabilizers.  We discussed referral to formal physical therapy if she struggles with doing the  exercises on her own.  She may continue with her workouts but will refrain from any repetitive overhead movements.  For now, she may stick with over-the-counter NSAIDs as needed.  Follow-up with me again in 4 weeks for reevaluation if symptoms or not improving.  At that time, I would plan on imaging in the form of x-ray and point-of-care ultrasound.  This note was dictated using Dragon naturally speaking software and may contain errors in syntax, spelling, or content which have not been identified prior to signing this note.

## 2022-04-18 DIAGNOSIS — R768 Other specified abnormal immunological findings in serum: Secondary | ICD-10-CM | POA: Diagnosis not present

## 2022-04-18 DIAGNOSIS — M79642 Pain in left hand: Secondary | ICD-10-CM | POA: Diagnosis not present

## 2022-04-18 DIAGNOSIS — M79672 Pain in left foot: Secondary | ICD-10-CM | POA: Diagnosis not present

## 2022-04-18 DIAGNOSIS — M79671 Pain in right foot: Secondary | ICD-10-CM | POA: Diagnosis not present

## 2022-04-18 DIAGNOSIS — M2550 Pain in unspecified joint: Secondary | ICD-10-CM | POA: Diagnosis not present

## 2022-04-18 DIAGNOSIS — M79641 Pain in right hand: Secondary | ICD-10-CM | POA: Diagnosis not present

## 2022-04-18 DIAGNOSIS — I7300 Raynaud's syndrome without gangrene: Secondary | ICD-10-CM | POA: Diagnosis not present

## 2022-04-18 DIAGNOSIS — M1990 Unspecified osteoarthritis, unspecified site: Secondary | ICD-10-CM | POA: Diagnosis not present

## 2022-04-20 ENCOUNTER — Ambulatory Visit (INDEPENDENT_AMBULATORY_CARE_PROVIDER_SITE_OTHER): Payer: BC Managed Care – PPO | Admitting: Sports Medicine

## 2022-04-20 VITALS — BP 106/72 | Ht 65.5 in | Wt 135.0 lb

## 2022-04-20 DIAGNOSIS — M7521 Bicipital tendinitis, right shoulder: Secondary | ICD-10-CM | POA: Diagnosis not present

## 2022-04-20 NOTE — Progress Notes (Signed)
   Subjective:    Patient ID: Brooke Green, female    DOB: [birth year], 62 y.o.   MRN: [id number]  HPI  Brooke Green presents today for follow-up.  Overall, her shoulder seems to be improving.  She has been diligent with her Jobe exercises.  She did have an episode of pain this past Monday.  Her shoulder was quite sore at the time.  It has since improved.  Her location of pain is the proximal biceps tendon where she is also noticing occasional clicking with shoulder movement.    Review of Systems As above    Objective:   Physical Exam  Well-developed, well-nourished no acute distress  Right shoulder: Full passive painless range of motion.  She is tender to palpation over the proximal biceps tendon.  No tenderness to palpation at the acromioclavicular joint.  Negative empty can, negative Hawkins.  Rotator cuff strength is 5/5 and does not reproduce pain.  Negative O'Brien's.  Positive speeds.  Negative Yergason's.     Assessment & Plan:   Right shoulder pain likely secondary to proximal biceps tendinitis  Although I had initially thought that her symptoms were due to rotator cuff impingement, I now feel like her symptoms are more consistent with proximal biceps tendinitis.  I recommended referral to renew PT with a subsequent follow-up appointment with me in 4 weeks.  We will defer ultrasound for now but will obviously reconsider if symptoms do not improve at follow-up.  This note was dictated using Dragon naturally speaking software and may contain errors in syntax, spelling, or content which have not been identified prior to signing this note.

## 2022-04-26 DIAGNOSIS — Z6822 Body mass index (BMI) 22.0-22.9, adult: Secondary | ICD-10-CM | POA: Diagnosis not present

## 2022-04-26 DIAGNOSIS — Z01419 Encounter for gynecological examination (general) (routine) without abnormal findings: Secondary | ICD-10-CM | POA: Diagnosis not present

## 2022-04-26 DIAGNOSIS — Z1231 Encounter for screening mammogram for malignant neoplasm of breast: Secondary | ICD-10-CM | POA: Diagnosis not present

## 2022-04-27 DIAGNOSIS — M25519 Pain in unspecified shoulder: Secondary | ICD-10-CM | POA: Diagnosis not present

## 2022-05-03 DIAGNOSIS — M25519 Pain in unspecified shoulder: Secondary | ICD-10-CM | POA: Diagnosis not present

## 2022-05-10 DIAGNOSIS — M25519 Pain in unspecified shoulder: Secondary | ICD-10-CM | POA: Diagnosis not present

## 2022-05-16 DIAGNOSIS — M25519 Pain in unspecified shoulder: Secondary | ICD-10-CM | POA: Diagnosis not present

## 2022-05-23 ENCOUNTER — Ambulatory Visit: Payer: BC Managed Care – PPO | Admitting: Sports Medicine

## 2022-05-28 DIAGNOSIS — M25519 Pain in unspecified shoulder: Secondary | ICD-10-CM | POA: Diagnosis not present

## 2022-05-31 DIAGNOSIS — M25519 Pain in unspecified shoulder: Secondary | ICD-10-CM | POA: Diagnosis not present

## 2022-06-06 ENCOUNTER — Ambulatory Visit (INDEPENDENT_AMBULATORY_CARE_PROVIDER_SITE_OTHER): Payer: BC Managed Care – PPO | Admitting: Sports Medicine

## 2022-06-06 VITALS — BP 106/68 | Ht 65.5 in | Wt 138.0 lb

## 2022-06-06 DIAGNOSIS — M7521 Bicipital tendinitis, right shoulder: Secondary | ICD-10-CM

## 2022-06-06 NOTE — Progress Notes (Signed)
   Subjective:    Patient ID: Brooke Green, female    DOB: [birth year], 62 y.o.   MRN: [id number]  HPI Brooke Green presents today for follow-up on right shoulder pain thought to be secondary to proximal biceps tendinitis.  She had improved with physical therapy but unfortunately reinjured the same shoulder a couple of weeks ago when lifting up a cooler.  Her pain at that time was more diffuse in the shoulder but it has settled once again in the anterior shoulder near the proximal biceps tendon.  She did not notice any bruising or swelling.  She does state that her pain seems to be improving.  She continues with physical therapy.    Review of Systems As above    Objective:   Physical Exam   Well-developed, well-nourished.  No acute distress  Right shoulder: Full active range of motion.  No soft tissue swelling or ecchymosis.  There is tenderness to palpation in the bicipital groove.  Rotator cuff strength is 5/5.  Positive empty can but negative Hawkins.  No reproducible pain with resisted rotator cuff stressing.  Positive O'Brien's.  Positive speeds.  Negative Yergason's.  Neurovascularly intact distally.     Assessment & Plan:   Right shoulder pain likely secondary to partial biceps tendon tear versus degenerative labral tear  I recommended that Aparna return to the office in a couple of weeks for complete right shoulder ultrasound if her symptoms do not continue to improve.  In the meantime, she will continue with physical therapy and she may return to the gym in a limited capacity lifting very light weights and avoiding any exercises that reproduce pain.  We briefly discussed the use of topical nitroglycerin to help with tendon healing if ultrasound does confirm a tendon tear.  This note was dictated using Dragon naturally speaking software and may contain errors in syntax, spelling, or content which have not been identified prior to signing this note.

## 2022-06-08 DIAGNOSIS — M25519 Pain in unspecified shoulder: Secondary | ICD-10-CM | POA: Diagnosis not present

## 2022-06-09 DIAGNOSIS — H40013 Open angle with borderline findings, low risk, bilateral: Secondary | ICD-10-CM | POA: Diagnosis not present

## 2022-06-14 DIAGNOSIS — M25519 Pain in unspecified shoulder: Secondary | ICD-10-CM | POA: Diagnosis not present

## 2022-06-22 ENCOUNTER — Ambulatory Visit: Payer: BC Managed Care – PPO | Admitting: Sports Medicine

## 2022-06-22 DIAGNOSIS — M25519 Pain in unspecified shoulder: Secondary | ICD-10-CM | POA: Diagnosis not present

## 2022-07-05 DIAGNOSIS — M25519 Pain in unspecified shoulder: Secondary | ICD-10-CM | POA: Diagnosis not present

## 2022-07-06 ENCOUNTER — Other Ambulatory Visit: Payer: Self-pay | Admitting: Internal Medicine

## 2022-07-06 ENCOUNTER — Other Ambulatory Visit: Payer: Self-pay

## 2022-07-06 ENCOUNTER — Ambulatory Visit (INDEPENDENT_AMBULATORY_CARE_PROVIDER_SITE_OTHER): Payer: BC Managed Care – PPO | Admitting: Sports Medicine

## 2022-07-06 VITALS — BP 112/72 | Ht 65.5 in | Wt 135.0 lb

## 2022-07-06 DIAGNOSIS — M7541 Impingement syndrome of right shoulder: Secondary | ICD-10-CM

## 2022-07-06 DIAGNOSIS — M7521 Bicipital tendinitis, right shoulder: Secondary | ICD-10-CM

## 2022-07-06 MED ORDER — DICLOFENAC SODIUM 2 % EX SOLN
2.0000 | Freq: Two times a day (BID) | CUTANEOUS | 0 refills | Status: AC | PRN
Start: 2022-07-06 — End: ?

## 2022-07-06 NOTE — Progress Notes (Signed)
PCP: Brooke Brunette, MD  Subjective:   HPI: Patient is a 62 y.o. female here for ultrasound of her right shoulder.  Differential includes partial biceps tendon tear versus degenerative labral tear. See note from 06/06/2022.         Objective:  Physical Exam:  Gen: NAD, comfortable in exam room  Point-of-care ultrasound:  Patient has hypoechoic changes at her biceps tendon.  Short axis view of the biceps tendon has "bulls eye" sign. No tear of bicep appreciated in short or long axis of bicep.  Minimal arthritic changes AC joint with minimal hypoechoic changes.  Subscapularis, supraspinatus, and infraspinatus intact without tear or hypoechoic changes.  Findings suggest possible labral tear resulting in fluid in the proximal biceps tendon.  Assessment & Plan:  1.  Degenerative labral tear Patient continues to have pain although she has been undergoing physical therapy for her rotator cuff muscle.  She presented today for ultrasound which showed bull's-eye sign and intact bicep tendon. The bulls eye sign is made by communicating fluid from glenohumeral joint. In this case I expect this is from degenerative changes in labrum. Recommend patient continue physical therapy for rotator cough because it has improve pain.  Likely because she has more support from muscles the glenohumeral joint.  Patient has found 2% diclofenac gel helpful for her arthritis previously. Prescribed 2% diclofenac gel today to help with discomfort at shoulder and patient is aware this may not be on her insurance company's formulary. . She can take occasional over the counter NSAID or Tylenol as needed.  Avoid activities which make pain worse. No need for additional imaging unless this shoulder discomfort worsens or is preventing patient from doing her everyday activities  Patient seen and evaluated with the ultrasound fellow.  I agree with the above plan of care.  Ultrasound findings as above.  Patient's symptoms are likely  originating from a degenerative labral tear of the right shoulder.  Fortunately, her pain has improved with physical therapy.  She will continue with PT for now until ready for discharge to home exercise program.  She would also like to try some Pennsaid so we will prescribe that as well.  If pain returns and symptoms warrant then we could consider cortisone injection.  She will follow-up as needed.

## 2022-07-26 DIAGNOSIS — M25519 Pain in unspecified shoulder: Secondary | ICD-10-CM | POA: Diagnosis not present

## 2022-11-28 DIAGNOSIS — C73 Malignant neoplasm of thyroid gland: Secondary | ICD-10-CM | POA: Diagnosis not present

## 2022-11-28 DIAGNOSIS — E890 Postprocedural hypothyroidism: Secondary | ICD-10-CM | POA: Diagnosis not present

## 2022-12-05 ENCOUNTER — Other Ambulatory Visit: Payer: Self-pay | Admitting: Endocrinology

## 2022-12-05 DIAGNOSIS — Z808 Family history of malignant neoplasm of other organs or systems: Secondary | ICD-10-CM | POA: Diagnosis not present

## 2022-12-05 DIAGNOSIS — C73 Malignant neoplasm of thyroid gland: Secondary | ICD-10-CM

## 2022-12-05 DIAGNOSIS — E890 Postprocedural hypothyroidism: Secondary | ICD-10-CM | POA: Diagnosis not present

## 2022-12-14 ENCOUNTER — Ambulatory Visit
Admission: RE | Admit: 2022-12-14 | Discharge: 2022-12-14 | Disposition: A | Discharge status: Home / Self Care | Payer: BC Managed Care – PPO | Source: Ambulatory Visit | Attending: Endocrinology | Admitting: Endocrinology

## 2022-12-14 DIAGNOSIS — C73 Malignant neoplasm of thyroid gland: Secondary | ICD-10-CM

## 2022-12-14 DIAGNOSIS — Z85850 Personal history of malignant neoplasm of thyroid: Secondary | ICD-10-CM | POA: Diagnosis not present

## 2022-12-19 DIAGNOSIS — H40013 Open angle with borderline findings, low risk, bilateral: Secondary | ICD-10-CM | POA: Diagnosis not present

## 2022-12-21 DIAGNOSIS — Z13820 Encounter for screening for osteoporosis: Secondary | ICD-10-CM | POA: Diagnosis not present

## 2023-01-16 DIAGNOSIS — L710 Perioral dermatitis: Secondary | ICD-10-CM | POA: Diagnosis not present

## 2023-01-22 DIAGNOSIS — H538 Other visual disturbances: Secondary | ICD-10-CM | POA: Diagnosis not present

## 2023-02-02 DIAGNOSIS — Z03818 Encounter for observation for suspected exposure to other biological agents ruled out: Secondary | ICD-10-CM | POA: Diagnosis not present

## 2023-02-02 DIAGNOSIS — R051 Acute cough: Secondary | ICD-10-CM | POA: Diagnosis not present

## 2023-02-02 DIAGNOSIS — J329 Chronic sinusitis, unspecified: Secondary | ICD-10-CM | POA: Diagnosis not present

## 2023-02-02 DIAGNOSIS — J40 Bronchitis, not specified as acute or chronic: Secondary | ICD-10-CM | POA: Diagnosis not present

## 2023-02-06 DIAGNOSIS — D2271 Melanocytic nevi of right lower limb, including hip: Secondary | ICD-10-CM | POA: Diagnosis not present

## 2023-02-06 DIAGNOSIS — D2262 Melanocytic nevi of left upper limb, including shoulder: Secondary | ICD-10-CM | POA: Diagnosis not present

## 2023-02-06 DIAGNOSIS — L821 Other seborrheic keratosis: Secondary | ICD-10-CM | POA: Diagnosis not present

## 2023-02-06 DIAGNOSIS — D2261 Melanocytic nevi of right upper limb, including shoulder: Secondary | ICD-10-CM | POA: Diagnosis not present

## 2023-04-04 DIAGNOSIS — Z0000 Encounter for general adult medical examination without abnormal findings: Secondary | ICD-10-CM | POA: Diagnosis not present

## 2023-04-04 DIAGNOSIS — E7800 Pure hypercholesterolemia, unspecified: Secondary | ICD-10-CM | POA: Diagnosis not present

## 2023-04-04 DIAGNOSIS — E038 Other specified hypothyroidism: Secondary | ICD-10-CM | POA: Diagnosis not present

## 2023-05-30 DIAGNOSIS — M2550 Pain in unspecified joint: Secondary | ICD-10-CM | POA: Diagnosis not present

## 2023-05-30 DIAGNOSIS — M1990 Unspecified osteoarthritis, unspecified site: Secondary | ICD-10-CM | POA: Diagnosis not present

## 2023-05-30 DIAGNOSIS — I7300 Raynaud's syndrome without gangrene: Secondary | ICD-10-CM | POA: Diagnosis not present

## 2023-05-30 DIAGNOSIS — R768 Other specified abnormal immunological findings in serum: Secondary | ICD-10-CM | POA: Diagnosis not present

## 2023-06-18 DIAGNOSIS — J069 Acute upper respiratory infection, unspecified: Secondary | ICD-10-CM | POA: Diagnosis not present

## 2023-06-19 DIAGNOSIS — H40013 Open angle with borderline findings, low risk, bilateral: Secondary | ICD-10-CM | POA: Diagnosis not present

## 2023-06-27 DIAGNOSIS — M720 Palmar fascial fibromatosis [Dupuytren]: Secondary | ICD-10-CM | POA: Diagnosis not present

## 2023-06-27 DIAGNOSIS — M1811 Unilateral primary osteoarthritis of first carpometacarpal joint, right hand: Secondary | ICD-10-CM | POA: Diagnosis not present

## 2023-06-27 DIAGNOSIS — R2232 Localized swelling, mass and lump, left upper limb: Secondary | ICD-10-CM | POA: Diagnosis not present

## 2023-08-15 DIAGNOSIS — Z6823 Body mass index (BMI) 23.0-23.9, adult: Secondary | ICD-10-CM | POA: Diagnosis not present

## 2023-08-15 DIAGNOSIS — Z01419 Encounter for gynecological examination (general) (routine) without abnormal findings: Secondary | ICD-10-CM | POA: Diagnosis not present

## 2023-10-23 DIAGNOSIS — Z1231 Encounter for screening mammogram for malignant neoplasm of breast: Secondary | ICD-10-CM | POA: Diagnosis not present

## 2023-11-05 ENCOUNTER — Other Ambulatory Visit (HOSPITAL_BASED_OUTPATIENT_CLINIC_OR_DEPARTMENT_OTHER): Payer: Self-pay | Admitting: Family Medicine

## 2023-11-05 DIAGNOSIS — M79661 Pain in right lower leg: Secondary | ICD-10-CM

## 2023-11-05 DIAGNOSIS — M25561 Pain in right knee: Secondary | ICD-10-CM | POA: Diagnosis not present

## 2023-11-06 ENCOUNTER — Ambulatory Visit (HOSPITAL_BASED_OUTPATIENT_CLINIC_OR_DEPARTMENT_OTHER)
Admission: RE | Admit: 2023-11-06 | Discharge: 2023-11-06 | Disposition: A | Discharge status: Home / Self Care | Source: Ambulatory Visit | Attending: Family Medicine | Admitting: Family Medicine

## 2023-11-06 DIAGNOSIS — M79661 Pain in right lower leg: Secondary | ICD-10-CM | POA: Insufficient documentation

## 2023-11-06 DIAGNOSIS — M79604 Pain in right leg: Secondary | ICD-10-CM | POA: Diagnosis not present

## 2023-11-19 DIAGNOSIS — J329 Chronic sinusitis, unspecified: Secondary | ICD-10-CM | POA: Diagnosis not present

## 2023-11-28 DIAGNOSIS — C73 Malignant neoplasm of thyroid gland: Secondary | ICD-10-CM | POA: Diagnosis not present

## 2023-11-28 DIAGNOSIS — E890 Postprocedural hypothyroidism: Secondary | ICD-10-CM | POA: Diagnosis not present

## 2023-12-12 DIAGNOSIS — E890 Postprocedural hypothyroidism: Secondary | ICD-10-CM | POA: Diagnosis not present

## 2023-12-12 DIAGNOSIS — C73 Malignant neoplasm of thyroid gland: Secondary | ICD-10-CM | POA: Diagnosis not present

## 2023-12-12 DIAGNOSIS — M810 Age-related osteoporosis without current pathological fracture: Secondary | ICD-10-CM | POA: Diagnosis not present
